# Patient Record
Sex: Female | Born: 1965 | Race: White | Hispanic: No | Smoking: Never smoker
Health system: Southern US, Community
[De-identification: ages and names within clinical notes are randomized; demographics above are authoritative.]

## PROBLEM LIST (undated history)

## (undated) DIAGNOSIS — Z8701 Personal history of pneumonia (recurrent): Secondary | ICD-10-CM

## (undated) HISTORY — PX: OTHER SURGICAL HISTORY: SHX169

## (undated) HISTORY — DX: Personal history of pneumonia (recurrent): Z87.01

---

## 2015-04-11 ENCOUNTER — Ambulatory Visit (INDEPENDENT_AMBULATORY_CARE_PROVIDER_SITE_OTHER): Payer: BC Managed Care – PPO | Admitting: Family Medicine

## 2015-04-11 ENCOUNTER — Encounter: Payer: Self-pay | Admitting: Family Medicine

## 2015-04-11 VITALS — BP 129/84 | HR 62 | Temp 98.7°F | Ht 67.0 in | Wt 232.4 lb

## 2015-04-11 DIAGNOSIS — J209 Acute bronchitis, unspecified: Secondary | ICD-10-CM

## 2015-04-11 MED ORDER — HYDROCODONE-HOMATROPINE 5-1.5 MG/5ML PO SYRP: 5.0000 mL | ORAL_SOLUTION | Freq: Four times a day (QID) | ORAL | Status: DC | PRN

## 2015-04-11 MED ORDER — TRIAMCINOLONE ACETONIDE 40 MG/ML IJ SUSP
40.0000 mg | Freq: Once | INTRAMUSCULAR | Status: AC
  Administered 2015-04-11: 40 mg via INTRAMUSCULAR

## 2015-04-11 MED ORDER — FLUTICASONE PROPIONATE 50 MCG/ACT NA SUSP
2.0000 | Freq: Every day | NASAL | Status: DC
Start: 2015-04-11 — End: 2015-12-25

## 2015-04-11 MED ORDER — AZITHROMYCIN 250 MG PO TABS: ORAL_TABLET | ORAL | Status: DC

## 2015-04-11 NOTE — Progress Notes (Signed)
   HPI  Patient presents todayto establish care and discuss acute illness.  Plans that she's had a similar illness now 2-3 times over the last 3 months. She states that initially she moved to town from [facility] was seen in urgent care for cough, she was told it was a virus and did not completely resolve but her symptoms didn't get much better.  Early January, about 4 weeks ago she had similar illness with cough, malaise, and sinus congestion. This improved but did linger on.  Over the last 4 days she developed cough, nasal congestion, central chest pain with coughing and intermittent dyspnea. She does have malaise well. 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   PMH: Smoking status noted Past medical, surgical, social, family history reviewed and updated in EMR ROS: Per HPI  Objective: BP 129/84 mmHg  Pulse 62  Temp(Src) 98.7 F (37.1 C) (Oral)  Ht  (1.702 m)  Wt 232 lb 6.4 oz (105.416 kg)  BMI 36.39 kg/m2 Gen: NAD, alert, cooperative with exam HEENT: NCAT, nares with swollen mucosa and turbinates, TMs normal bilaterally, oropharynx clear with swollen tonsils CV: RRR, good S1/S2, no murmur Resp: CTABL, no wheezes, non-labored Ext: No edema, warm Neuro: Alert and oriented, No gross deficits  Assessment and plan:  # Acute bronchitis Given her current illness and current severity of cough with dyspnea I will go ahead and treat her with azithromycin Her lung exam is reassuring Given a shot of IM along today Hycodan for nighttime cough Flonase for postnasal drip Out of work today and tomorrow The clinic if worsening or does not improve as expected  2-3 months for annual exam   Murtis Sink, MD [facility] 04/11/2015, 3:45 PM

## 2015-04-11 NOTE — Patient Instructions (Signed)
Great to meet you!  I am treating you for acute bronchitis  Azithromycin is an antibiotic, be sure to finish all of the pills  Hycodan cough syrup has a narcotic in it, do not drive after taking it.  Flonase is for congestion and post nasal drip- twice daily for only 1 week (once daily os the standard everyday dose)  Acute Bronchitis Bronchitis is when the airways that extend from the windpipe into the lungs get red, puffy, and painful (inflamed). Bronchitis often causes thick spit (mucus) to develop. This leads to a cough. A cough is the most common symptom of bronchitis. In acute bronchitis, the condition usually begins suddenly and goes away over time (usually in 2 weeks). Smoking, allergies, and asthma can make bronchitis worse. Repeated episodes of bronchitis may cause more lung problems. HOME CARE  Rest.  Drink enough fluids to keep your pee (urine) clear or pale yellow (unless you need to limit fluids as told by your doctor).  Only take over-the-counter or prescription medicines as told by your doctor.  Avoid smoking and secondhand smoke. These can make bronchitis worse. If you are a smoker, think about using nicotine gum or skin patches. Quitting smoking will help your lungs heal faster.  Reduce the chance of getting bronchitis again by:  Washing your hands often.  Avoiding people with cold symptoms.  Trying not to touch your hands to your mouth, nose, or eyes.  Follow up with your doctor as told. GET HELP IF: Your symptoms do not improve after 1 week of treatment. Symptoms include:  Cough.  Fever.  Coughing up thick spit.  Body aches.  Chest congestion.  Chills.  Shortness of breath.  Sore throat. GET HELP RIGHT AWAY IF:   You have an increased fever.  You have chills.  You have severe shortness of breath.  You have bloody thick spit (sputum).  You throw up (vomit) often.  You lose too much body fluid (dehydration).  You have a severe  headache.  You faint. MAKE SURE YOU:   Understand these instructions.  Will watch your condition.  Will get help right away if you are not doing well or get worse.   This information is not intended to replace advice given to you by your health care provider. Make sure you discuss any questions you have with your health care provider.   Document Released: 08/13/2007 Document Revised: 10/27/2012 Document Reviewed: 08/17/2012 Elsevier Interactive Patient Education [employer]

## 2015-04-30 ENCOUNTER — Ambulatory Visit (INDEPENDENT_AMBULATORY_CARE_PROVIDER_SITE_OTHER): Payer: BC Managed Care – PPO | Admitting: Family Medicine

## 2015-04-30 ENCOUNTER — Encounter: Payer: Self-pay | Admitting: Family Medicine

## 2015-04-30 VITALS — BP 97/62 | HR 74 | Temp 97.3°F | Ht 67.0 in | Wt 230.4 lb

## 2015-04-30 DIAGNOSIS — J101 Influenza due to other identified influenza virus with other respiratory manifestations: Secondary | ICD-10-CM

## 2015-04-30 DIAGNOSIS — J209 Acute bronchitis, unspecified: Secondary | ICD-10-CM

## 2015-04-30 DIAGNOSIS — R51 Headache: Secondary | ICD-10-CM

## 2015-04-30 DIAGNOSIS — R059 Cough, unspecified: Secondary | ICD-10-CM

## 2015-04-30 DIAGNOSIS — R0981 Nasal congestion: Secondary | ICD-10-CM

## 2015-04-30 DIAGNOSIS — R05 Cough: Secondary | ICD-10-CM

## 2015-04-30 DIAGNOSIS — R519 Headache, unspecified: Secondary | ICD-10-CM

## 2015-04-30 LAB — POCT INFLUENZA A/B
INFLUENZA A, POC: POSITIVE — AB
INFLUENZA B, POC: NEGATIVE

## 2015-04-30 MED ORDER — HYDROCODONE-HOMATROPINE 5-1.5 MG/5ML PO SYRP: 5.0000 mL | ORAL_SOLUTION | Freq: Four times a day (QID) | ORAL | Status: DC | PRN

## 2015-04-30 NOTE — Addendum Note (Signed)
Addended by: Elenora Gamma on: 04/30/2015 04:01 PM   Modules accepted: Orders

## 2015-04-30 NOTE — Patient Instructions (Signed)

## 2015-04-30 NOTE — Progress Notes (Signed)
   HPI  Patient presents today for acute illness.  She describes 3 days of cough, nasal congestion, headache, shortness of breath, and malaise.  She's tolerating food and fluids normally.  She works as a Systems analyst.  She was seen about 3 weeks ago for similar illness and treated with azithromycin. She is worse this time than she was the first visit. She describes that her shortness of breath is much more than it was last time.  Hycodan helped well with the cough.  PMH: Smoking status noted ROS: Per HPI  Objective: BP 97/62 mmHg  Pulse 74  Temp(Src) 97.3 F (36.3 C) (Oral)  Ht  (1.702 m)  Wt 230 lb 6.4 oz (104.509 kg)  BMI 36.08 kg/m2 Gen: NAD, alert, cooperative with exam HEENT: NCAT, TMs normal bilaterally, nares clear, oropharynx clear CV: RRR, good S1/S2, no murmur Resp: Coarse breath sounds in the left lower lung field Ext: No edema, warm Neuro: Alert and oriented, No gross deficits  Assessment and plan:  # Influenza a Treat symptomatically, outside of the 48-hour window for Tamiflu treatment Hycodan for nighttime cough, Tylenol, Motrin, fluids Return to clinic for Worsening symptoms or further concerns  We discussed that hycodan is a narcotic cough syrup and should be used sparingly. She did not request it   Murtis Sink, MD [facility] 04/30/2015, 3:39 PM

## 2015-05-03 ENCOUNTER — Telehealth: Payer: Self-pay | Admitting: Family Medicine

## 2015-05-03 NOTE — Telephone Encounter (Signed)
Ok for note 

## 2015-05-03 NOTE — Telephone Encounter (Signed)
I am ok with note.   Murtis Sink, MD [facility] 05/03/2015, 9:49 AM

## 2015-05-03 NOTE — Telephone Encounter (Signed)
Work note printed and put in the mail, pt aware.

## 2015-05-14 ENCOUNTER — Other Ambulatory Visit: Payer: Self-pay | Admitting: Family Medicine

## 2015-05-15 MED ORDER — TRI-LINYAH 0.18/0.215/0.25 MG-35 MCG PO TABS: 1.0000 | ORAL_TABLET | Freq: Every day | ORAL | Status: DC

## 2015-05-15 NOTE — Telephone Encounter (Signed)
i have refilled  Please caution her that risks of serious side effects (blood clots etc) go up as we get older on these meds. We should be sure to discuss alternatives at an appointment in the next few months. Be sure to keep up with pap smears every 3 years.   Murtis SinkSam Aryona Sill, MD [facility] 05/15/2015, 9:18 AM

## 2015-05-15 NOTE — Addendum Note (Signed)
Addended by: Elenora GammaBRADSHAW, Emilya Justen L on: 05/15/2015 09:20 AM   Modules accepted: Orders

## 2015-05-15 NOTE — Telephone Encounter (Signed)
New Patient since Feb, can we refill?

## 2015-05-15 NOTE — Telephone Encounter (Signed)
Left pt a message rx has been sent to pharmacy but to call back and discuss the rest of the note.

## 2015-06-18 ENCOUNTER — Encounter: Payer: Self-pay | Admitting: *Deleted

## 2015-06-18 ENCOUNTER — Encounter: Payer: Self-pay | Admitting: Family Medicine

## 2015-06-18 ENCOUNTER — Ambulatory Visit (INDEPENDENT_AMBULATORY_CARE_PROVIDER_SITE_OTHER): Payer: BC Managed Care – PPO | Admitting: Family Medicine

## 2015-06-18 VITALS — BP 122/66 | HR 65 | Temp 97.4°F | Ht 67.0 in | Wt 234.4 lb

## 2015-06-18 DIAGNOSIS — Z0000 Encounter for general adult medical examination without abnormal findings: Secondary | ICD-10-CM

## 2015-06-18 DIAGNOSIS — Z01419 Encounter for gynecological examination (general) (routine) without abnormal findings: Secondary | ICD-10-CM

## 2015-06-18 NOTE — Patient Instructions (Addendum)
Great to see you!  Let splan on seeing you at least once a year for physical exam  You will need another pap in 3 years  wil call or send a mychart message within 1 week with your lab results

## 2015-06-18 NOTE — Progress Notes (Signed)
   HPI  Patient presents today here for physical exam Pap smear.  Patient has no complaints. She had a prolonged illness in the winter season is very happy to be healthy again.  She has been watching her diet pretty aggressively, limiting fried and fatty foods, carbohydrates, and portion control.  She is not exercising but is motivated to start.  As not currently sexually active We discussed the risks and benefits of oral contraceptives and she like to continue She is still having regular periods that last about 5 days every month on schedule   PMH: Smoking status noted ROS: Per HPI, otherwise negative  Objective: BP 122/66 mmHg  Pulse 65  Temp(Src) 97.4 F (36.3 C) (Oral)  Ht _0  (1.702 m)  Wt 234 lb 6.4 oz (106.323 kg)  BMI 36.70 kg/m2  LMP 06/11/2015 Gen: NAD, alert, cooperative with exam HEENT: NCAT, oral pharynx clear, TMs normal bilaterally, nares clear, PERRLA CV: RRR, good S1/S2, no murmur Resp: CTABL, no wheezes, non-labored Abd: SNTND, BS present, no guarding or organomegaly Ext: No edema, warm Neuro: Alert and oriented, strength 5/5 and sensation intact in all 4 extremities GU: Normal-appearing female perineum, nulliparous cervix, minimal DC No CMT, no adnexal fullness   Assessment and plan:  # Well woman exam mammo ordered, fasting labs Pap collected Annual exam unless needed sooner Discussed diet and exercise, she is motivated.   Orders Placed This Encounter  Procedures  . Lipid Panel  . CMP14+EGFR  . CBC with Differential  . TSH     Laroy Apple, MD [facility] 06/18/2015, 12:00 PM

## 2015-06-19 LAB — CMP14+EGFR
A/G RATIO: 1.4 (ref 1.2–2.2)
ALBUMIN: 3.9 g/dL (ref 3.5–5.5)
ALK PHOS: 71 IU/L (ref 39–117)
ALT: 11 IU/L (ref 0–32)
AST: 13 IU/L (ref 0–40)
BILIRUBIN TOTAL: 0.2 mg/dL (ref 0.0–1.2)
BUN / CREAT RATIO: 13 (ref 9–23)
BUN: 9 mg/dL (ref 6–24)
CHLORIDE: 102 mmol/L (ref 96–106)
CO2: 20 mmol/L (ref 18–29)
Calcium: 9.3 mg/dL (ref 8.7–10.2)
Creatinine, Ser: 0.71 mg/dL (ref 0.57–1.00)
GFR calc Af Amer: 116 mL/min/{1.73_m2} (ref >59)
GFR calc non Af Amer: 101 mL/min/{1.73_m2} (ref >59)
GLOBULIN, TOTAL: 2.7 g/dL (ref 1.5–4.5)
Glucose: 85 mg/dL (ref 65–99)
POTASSIUM: 4.2 mmol/L (ref 3.5–5.2)
SODIUM: 141 mmol/L (ref 134–144)
Total Protein: 6.6 g/dL (ref 6.0–8.5)

## 2015-06-19 LAB — CBC WITH DIFFERENTIAL/PLATELET
BASOS ABS: 0 10*3/uL (ref 0.0–0.2)
Basos: 0 %
EOS (ABSOLUTE): 0.1 10*3/uL (ref 0.0–0.4)
EOS: 1 %
HEMATOCRIT: 36.1 % (ref 34.0–46.6)
HEMOGLOBIN: 11.9 g/dL (ref 11.1–15.9)
IMMATURE GRANS (ABS): 0 10*3/uL (ref 0.0–0.1)
Immature Granulocytes: 1 %
LYMPHS ABS: 1.4 10*3/uL (ref 0.7–3.1)
Lymphs: 22 %
MCH: 29.2 pg (ref 26.6–33.0)
MCHC: 33 g/dL (ref 31.5–35.7)
MCV: 89 fL (ref 79–97)
Monocytes Absolute: 0.4 10*3/uL (ref 0.1–0.9)
Monocytes: 6 %
NEUTROS ABS: 4.7 10*3/uL (ref 1.4–7.0)
Neutrophils: 70 %
Platelets: 301 10*3/uL (ref 150–379)
RBC: 4.08 x10E6/uL (ref 3.77–5.28)
RDW: 13 % (ref 12.3–15.4)
WBC: 6.6 10*3/uL (ref 3.4–10.8)

## 2015-06-19 LAB — LIPID PANEL
CHOL/HDL RATIO: 2.7 ratio (ref 0.0–4.4)
Cholesterol, Total: 176 mg/dL (ref 100–199)
HDL: 65 mg/dL (ref >39)
LDL Calculated: 88 mg/dL (ref 0–99)
Triglycerides: 116 mg/dL (ref 0–149)
VLDL Cholesterol Cal: 23 mg/dL (ref 5–40)

## 2015-06-19 LAB — TSH: TSH: 2 u[IU]/mL (ref 0.450–4.500)

## 2015-06-20 LAB — PAP IG W/ RFLX HPV ASCU: PAP Smear Comment: 0

## 2015-10-08 ENCOUNTER — Encounter: Payer: BC Managed Care – PPO | Admitting: *Deleted

## 2015-12-25 ENCOUNTER — Ambulatory Visit (INDEPENDENT_AMBULATORY_CARE_PROVIDER_SITE_OTHER): Payer: BC Managed Care – PPO | Admitting: Family Medicine

## 2015-12-25 ENCOUNTER — Encounter: Payer: Self-pay | Admitting: Family Medicine

## 2015-12-25 VITALS — BP 128/81 | HR 68 | Temp 97.6°F | Ht 67.0 in | Wt 251.6 lb

## 2015-12-25 DIAGNOSIS — J209 Acute bronchitis, unspecified: Secondary | ICD-10-CM

## 2015-12-25 MED ORDER — HYDROCODONE-HOMATROPINE 5-1.5 MG/5ML PO SYRP: 5.0000 mL | ORAL_SOLUTION | Freq: Four times a day (QID) | ORAL | 0 refills | Status: DC | PRN

## 2015-12-25 MED ORDER — METHYLPREDNISOLONE ACETATE 80 MG/ML IJ SUSP
80.0000 mg | Freq: Once | INTRAMUSCULAR | Status: AC
  Administered 2015-12-25: 80 mg via INTRAMUSCULAR

## 2015-12-25 NOTE — Addendum Note (Signed)
Addended by: Lorelee CoverOSTOSKY, JESSICA C on: 12/25/2015 10:46 AM   Modules accepted: Orders

## 2015-12-25 NOTE — Progress Notes (Signed)
   HPI  Patient presents today here with cough and cold symptoms.  Patient reports 4 days of cough, nasal congestion, occipital headache, and one day symptoms of sore throat that has resolved.  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  She has body aches and malaise as well. She is tolerating foods and fluids normally. Sh e is not a smoker.   PMH: Smoking status noted ROS: Per HPI  Objective: BP 128/81   Pulse 68   Temp 97.6 F (36.4 C) (Oral)   Ht 5\' 7"  (1.702 m)   Wt 251 lb 9.6 oz (114.1 kg)   BMI 39.41 kg/m  Gen: NAD, alert, cooperative with exam HEENT: NCAT, nares with swelling, mild erythema of the oropharynx, TMs WNL CV: RRR, good S1/S2, no murmur Resp: CTABL, no wheezes, non-labored Ext: No edema, warm Neuro: Alert and oriented, No gross deficits  Assessment and plan:  # Acute bronchitis Likely viral etiology, given IM depo medroll, hycodan for night tim ecough, and discussed supportive care  RTC if worsening or not improving s expected.     Meds ordered this encounter  Medications  . HYDROcodone-homatropine (HYCODAN) 5-1.5 MG/5ML syrup    Sig: Take 5 mLs by mouth every 6 (six) hours as needed for cough.    Dispense:  120 mL    Refill:  0    Murtis SinkSam Bradshaw, MD [facility] 12/25/2015, 10:44 AM

## 2015-12-25 NOTE — Patient Instructions (Signed)
Great to see you!  Try mucinex DM 12 hour for day-time cough  Hycodan has hydrocodone in it, do not drive after you take it.    Acute Bronchitis Bronchitis is when the airways that extend from the windpipe into the lungs get red, puffy, and painful (inflamed). Bronchitis often causes thick spit (mucus) to develop. This leads to a cough. A cough is the most common symptom of bronchitis. In acute bronchitis, the condition usually begins suddenly and goes away over time (usually in 2 weeks). Smoking, allergies, and asthma can make bronchitis worse. Repeated episodes of bronchitis may cause more lung problems. HOME CARE  Rest.  Drink enough fluids to keep your pee (urine) clear or pale yellow (unless you need to limit fluids as told by your doctor).  Only take over-the-counter or prescription medicines as told by your doctor.  Avoid smoking and secondhand smoke. These can make bronchitis worse. If you are a smoker, think about using nicotine gum or skin patches. Quitting smoking will help your lungs heal faster.  Reduce the chance of getting bronchitis again by:  Washing your hands often.  Avoiding people with cold symptoms.  Trying not to touch your hands to your mouth, nose, or eyes.  Follow up with your doctor as told. GET HELP IF: Your symptoms do not improve after 1 week of treatment. Symptoms include:  Cough.  Fever.  Coughing up thick spit.  Body aches.  Chest congestion.  Chills.  Shortness of breath.  Sore throat. GET HELP RIGHT AWAY IF:   You have an increased fever.  You have chills.  You have severe shortness of breath.  You have bloody thick spit (sputum).  You throw up (vomit) often.  You lose too much body fluid (dehydration).  You have a severe headache.  You faint. MAKE SURE YOU:   Understand these instructions.  Will watch your condition.  Will get help right away if you are not doing well or get worse.   This information is not  intended to replace advice given to you by your health care provider. Make sure you discuss any questions you have with your health care provider.   Document Released: 08/13/2007 Document Revised: 10/27/2012 Document Reviewed: 08/17/2012 Elsevier Interactive Patient Education [employer] Else[employer] Inc.

## 2016-02-04 ENCOUNTER — Ambulatory Visit (INDEPENDENT_AMBULATORY_CARE_PROVIDER_SITE_OTHER): Payer: BC Managed Care – PPO | Admitting: Nurse Practitioner

## 2016-02-04 ENCOUNTER — Encounter: Payer: Self-pay | Admitting: Nurse Practitioner

## 2016-02-04 VITALS — BP 149/82 | HR 59 | Temp 97.4°F | Ht 67.0 in | Wt 249.0 lb

## 2016-02-04 DIAGNOSIS — J209 Acute bronchitis, unspecified: Secondary | ICD-10-CM | POA: Diagnosis not present

## 2016-02-04 MED ORDER — HYDROCODONE-HOMATROPINE 5-1.5 MG/5ML PO SYRP: 5.0000 mL | ORAL_SOLUTION | Freq: Four times a day (QID) | ORAL | 0 refills | Status: DC | PRN

## 2016-02-04 MED ORDER — AZITHROMYCIN 250 MG PO TABS: ORAL_TABLET | ORAL | 0 refills | Status: DC

## 2016-02-04 MED ORDER — METHYLPREDNISOLONE ACETATE 80 MG/ML IJ SUSP
80.0000 mg | Freq: Once | INTRAMUSCULAR | Status: AC
  Administered 2016-02-04: 80 mg via INTRAMUSCULAR

## 2016-02-04 NOTE — Progress Notes (Signed)
Subjective:     Brenda Hill is a 50 y.o. female here for evaluation of a cough. Onset of symptoms was 4 days ago. Symptoms have been gradually worsening since that time. The cough is barky, dry, harsh, hoarse and nonproductive and is aggravated by nothing. Associated symptoms include: change in voice and shortness of breath. Patient does not have a history of asthma. Patient does not have a history of environmental allergens. Patient has not traveled recently. Patient does have a history of smoking. Patient has had a previous chest x-ray. Patient has not had a PPD done.  The following portions of the patient's history were reviewed and updated as appropriate: allergies, current medications, past family history, past medical history, past social history, past surgical history and problem list.  Review of Systems Pertinent items noted in HPI and remainder of comprehensive ROS otherwise negative.    Objective:     BP (!) 149/82   Pulse (!) 59   Temp 97.4 F (36.3 C) (Oral)   Ht 5\' 7"  (1.702 m)   Wt 249 lb (112.9 kg)   BMI 39.00 kg/m  General appearance: alert, cooperative and mild distress Eyes: conjunctivae/corneas clear. PERRL, EOM's intact. Fundi benign. Ears: normal TM's and external ear canals both ears Nose: Nares normal. Septum midline. Mucosa normal. No drainage or sinus tenderness. Throat: lips, mucosa, and tongue normal; teeth and gums normal Neck: no adenopathy, no carotid bruit, no JVD, supple, symmetrical, trachea midline and thyroid not enlarged, symmetric, no tenderness/mass/nodules Lungs: clear to auscultation bilaterally and deep dry cough Heart: regular rate and rhythm, S1, S2 normal, no murmur, click, rub or gallop    Assessment:    Acute Bronchitis    Plan:     1. Take meds as prescribed 2. Use a cool mist humidifier especially during the winter months and when heat has been humid. 3. Use saline nose sprays frequently 4. Saline irrigations of the nose can be  very helpful if done frequently.  * 4X daily for 1 week*  * Use of a nettie pot can be helpful with this. Follow directions with this* 5. Drink plenty of fluids 6. Keep thermostat turn down low 7.For any cough or congestion  Use plain Mucinex- regular strength or max strength is fine   * Children- consult with Pharmacist for dosing 8. For fever or aces or pains- take tylenol or ibuprofen appropriate for age and weight.  * for fevers greater than 101 orally you may alternate ibuprofen and tylenol every  3 hours.   Meds ordered this encounter  Medications  . azithromycin (ZITHROMAX Z-PAK) 250 MG tablet    Sig: As directed    Dispense:  6 tablet    Refill:  0    Order Specific Question:   Supervising Provider    Answer:   VINCENT, CAROL L [[id number]]  . HYDROcodone-homatropine (HYCODAN) 5-1.5 MG/5ML syrup    Sig: Take 5 mLs by mouth every 6 (six) hours as needed for cough.    Dispense:  120 mL    Refill:  0    Order Specific Question:   Supervising Provider    Answer:   VINCENT, CAROL L [[id number]]  . methylPREDNISolone acetate (DEPO-MEDROL) injection 80 mg   Brenda Daphine DeutscherMartin, FNP

## 2016-02-04 NOTE — Patient Instructions (Signed)

## 2016-02-08 ENCOUNTER — Encounter: Payer: Self-pay | Admitting: Family Medicine

## 2016-02-08 ENCOUNTER — Ambulatory Visit (INDEPENDENT_AMBULATORY_CARE_PROVIDER_SITE_OTHER): Payer: BC Managed Care – PPO

## 2016-02-08 ENCOUNTER — Ambulatory Visit (INDEPENDENT_AMBULATORY_CARE_PROVIDER_SITE_OTHER): Payer: BC Managed Care – PPO | Admitting: Family Medicine

## 2016-02-08 VITALS — BP 115/75 | HR 68 | Temp 97.9°F | Ht 67.0 in | Wt 246.8 lb

## 2016-02-08 DIAGNOSIS — R05 Cough: Secondary | ICD-10-CM | POA: Diagnosis not present

## 2016-02-08 DIAGNOSIS — R059 Cough, unspecified: Secondary | ICD-10-CM

## 2016-02-08 MED ORDER — ALBUTEROL SULFATE HFA 108 (90 BASE) MCG/ACT IN AERS: 2.0000 | INHALATION_SPRAY | Freq: Four times a day (QID) | RESPIRATORY_TRACT | 0 refills | Status: DC | PRN

## 2016-02-08 MED ORDER — BENZONATATE 200 MG PO CAPS: 200.0000 mg | ORAL_CAPSULE | Freq: Two times a day (BID) | ORAL | 0 refills | Status: DC | PRN

## 2016-02-08 NOTE — Patient Instructions (Signed)
Great to see you!  Try the inhaler 2 puffs 4 times a day, also try tessalon poerles.   You have any sudden worsening symptoms, increasing shortness of breath, chest pain, or coughing up blood please seek emergency medical care.

## 2016-02-08 NOTE — Progress Notes (Signed)
   HPI  Patient presents today with persistent cough. Patient was seen initially about 5-6 weeks ago with one week of cough. She was treated with IM Depo-Medrol and supportive care that time.  She was seen again 5 days ago and treated with azithromycin, Hycodan, and I am Depo-Medrol again.  Over the week she has felt better on one day, however that evening she had the worst symptoms that she has had throughout the entire episode. She persistently has cough. She states that she has increased shortness of breath. She is not a smoker.  She denies chest pain except with severe coughing fits. She's tolerating foods and fluids normally. She has no sore throat, chills, sweats, or fever.  PMH: Smoking status noted ROS: Per HPI  Objective: BP 115/75   Pulse 68   Temp 97.9 F (36.6 C) (Oral)   Ht 5\' 7"  (1.702 m)   Wt 246 lb 12.8 oz (111.9 kg)   BMI 38.65 kg/m  Gen: NAD, alert, cooperative with exam HEENT: NCAT, oropharynx moist and clear, TMs normal bilaterally, nares clear CV: RRR, good S1/S2, no murmur Resp: CTABL, no wheezes, non-labored, very frequent cough and  Ext: No edema, warm Neuro: Alert and oriented, No gross deficits  CXR- Hyper expanded, no infiltrate  Assessment and plan:  # Cough, acute bronchitis I agree, most likely acute bronchitis Patient has been treated with 2 rounds of IM Depo-Medrol with not much improvement. Azithromycin course is finished, however will be effective for 5 additional days He had mild improvement with nebulizer in clinic today of albuterol Albuterol MDI given, Tessalon Perles given  I did consider pulmonary embolism, however her Wells score is 0,   Orders Placed This Encounter  Procedures  . DG Chest 2 View    Order Specific Question:   Reason for Exam (SYMPTOM  OR DIAGNOSIS REQUIRED)    Answer:   cough    Order Specific Question:   Is the patient pregnant?    Answer:   No    Order Specific Question:   Preferred imaging location?    Answer:   Internal  . CBC with Differential    Meds ordered this encounter  Medications  . benzonatate (TESSALON) 200 MG capsule    Sig: Take 1 capsule (200 mg total) by mouth 2 (two) times daily as needed for cough.    Dispense:  20 capsule    Refill:  0  . albuterol (PROVENTIL HFA;VENTOLIN HFA) 108 (90 Base) MCG/ACT inhaler    Sig: Inhale 2 puffs into the lungs every 6 (six) hours as needed for wheezing or shortness of breath.    Dispense:  1 Inhaler    Refill:  0    Murtis SinkSam Haniel Fix, MD [facility] 02/08/2016, 11:15 AM

## 2016-04-05 ENCOUNTER — Other Ambulatory Visit: Payer: Self-pay | Admitting: Family Medicine

## 2016-06-27 ENCOUNTER — Encounter: Payer: Self-pay | Admitting: Family Medicine

## 2016-06-27 ENCOUNTER — Ambulatory Visit (INDEPENDENT_AMBULATORY_CARE_PROVIDER_SITE_OTHER): Payer: BC Managed Care – PPO | Admitting: Family Medicine

## 2016-06-27 VITALS — BP 121/76 | HR 57 | Temp 97.1°F | Ht 67.0 in | Wt 260.0 lb

## 2016-06-27 DIAGNOSIS — T148XXA Other injury of unspecified body region, initial encounter: Secondary | ICD-10-CM

## 2016-06-27 DIAGNOSIS — M79604 Pain in right leg: Secondary | ICD-10-CM | POA: Diagnosis not present

## 2016-06-27 DIAGNOSIS — M542 Cervicalgia: Secondary | ICD-10-CM

## 2016-06-27 DIAGNOSIS — M545 Low back pain: Secondary | ICD-10-CM

## 2016-06-27 DIAGNOSIS — M79605 Pain in left leg: Secondary | ICD-10-CM

## 2016-06-27 MED ORDER — NAPROXEN 500 MG PO TABS: 500.0000 mg | ORAL_TABLET | Freq: Two times a day (BID) | ORAL | 0 refills | Status: DC

## 2016-06-27 MED ORDER — CYCLOBENZAPRINE HCL 10 MG PO TABS: 10.0000 mg | ORAL_TABLET | Freq: Three times a day (TID) | ORAL | 0 refills | Status: DC | PRN

## 2016-06-27 NOTE — Patient Instructions (Addendum)
Great to see you!  Try the naproxen 2 times daily with food for 10 days Use flexeril as needed, this may make you sleepy.   Try to reduce the amount of time you rare exercising and slowly build over th eperiod of a few weeks.

## 2016-06-27 NOTE — Progress Notes (Signed)
   HPI  Patient presents today with pain.  Patient explains that she developed left leg pain, back pain, and neck pain after beginning walking a few weeks ago. Asian states that she began her walking regimen on a daily basis. She had left leg pain after the week, then she mowed her grass with a positional or causing worsening left leg pain, left low back pain, and neck pain.  Leg pain is described as lateral lower leg pain hurting most the time associated with the left low back pain. Her left low back pain does radiate down to the left lower leg, she also has midline neck pain on occasion, this is less severe than the other 2 pains, however it is persistent and does seem to be exacerbated by exercise.  She is very interested in losing weight and wanting to exercise more.  PMH: Smoking status noted ROS: Per HPI  Objective: BP 121/76   Pulse (!) 57   Temp 97.1 F (36.2 C) (Oral)   Ht  (1.702 m)   Wt 260 lb (117.9 kg)   BMI 40.72 kg/m  Gen: NAD, alert, cooperative with exam HEENT: NCAT CV: RRR, good S1/S2, no murmur Resp: CTABL, no wheezes, non-labored Ext: No edema, warm Neuro: Alert and oriented, No gross deficits  MSK:  Negative Pearlean Brownie test of the left hip No tenderness to palpation of the left lateral lower leg, no tenderness to palpation of the midline cervical spine or lumbar spine or paraspinal muscles.  Assessment and plan:  # Muscle strain Several areas consistent with muscle strain and possible sciatica. Treat with scheduled NSAIDs 10 days, Flexeril, and reducing the amount of time exercising. I recommended a lower starting point on intensity of exercise with a slow increase over time.   Meds ordered this encounter  Medications  . naproxen (NAPROSYN) 500 MG tablet    Sig: Take 1 tablet (500 mg total) by mouth 2 (two) times daily with a meal.    Dispense:  20 tablet    Refill:  0  . cyclobenzaprine (FLEXERIL) 10 MG tablet    Sig: Take 1 tablet (10 mg total)  by mouth 3 (three) times daily as needed for muscle spasms.    Dispense:  30 tablet    Refill:  0    Murtis Sink, MD [facility] 06/27/2016, 2:45 PM

## 2016-07-29 ENCOUNTER — Encounter: Payer: Self-pay | Admitting: Family Medicine

## 2016-07-29 ENCOUNTER — Ambulatory Visit (INDEPENDENT_AMBULATORY_CARE_PROVIDER_SITE_OTHER): Payer: BC Managed Care – PPO | Admitting: Family Medicine

## 2016-07-29 VITALS — BP 110/74 | HR 63 | Temp 97.7°F | Ht 67.0 in | Wt 255.2 lb

## 2016-07-29 DIAGNOSIS — R1013 Epigastric pain: Secondary | ICD-10-CM | POA: Diagnosis not present

## 2016-07-29 MED ORDER — PANTOPRAZOLE SODIUM 40 MG PO TBEC: 40.0000 mg | DELAYED_RELEASE_TABLET | Freq: Every day | ORAL | 0 refills | Status: DC

## 2016-07-29 MED ORDER — ONDANSETRON 4 MG PO TBDP: 4.0000 mg | ORAL_TABLET | Freq: Three times a day (TID) | ORAL | 1 refills | Status: AC | PRN

## 2016-07-29 MED ORDER — DICYCLOMINE HCL 20 MG PO TABS: 20.0000 mg | ORAL_TABLET | Freq: Three times a day (TID) | ORAL | 0 refills | Status: DC

## 2016-07-29 NOTE — Patient Instructions (Signed)
Great to see you!  Start protonix 1 pill once daily   We are working on the ultrasound.

## 2016-07-29 NOTE — Progress Notes (Signed)
   HPI  Patient presents today here with abdominal pain for follow-up from the emergency room.  Patient explains that she's had 3 separate episodes of similar abdominal pain with nausea vomiting and diarrhea. These began about 3 weeks ago and lasted a few days each. On the most recent episode she could not tolerate fluids by mouth and ended up in the emergency room for IV fluids.  She's doing very well with Zofran twice daily and Bentyl twice daily.  Her abdominal pain has improved, however she has not tested anything more than thin liquids since she was seen in the emergency room.  The emergency room they were worried about her gall bladder  The pain is described as burning   PMH: Smoking status noted ROS: Per HPI  Objective: BP 110/74   Pulse 63   Temp 97.7 F (36.5 C) (Oral)   Ht 5\' 7"  (1.702 m)   Wt 255 lb 3.2 oz (115.8 kg)   BMI 39.97 kg/m  Gen: NAD, alert, cooperative with exam HEENT: NCAT CV: RRR, good S1/S2, no murmur Resp: CTABL, no wheezes, non-labored Abd: SNTND, BS present, no guarding or organomegaly Ext: No edema, warm Neuro: Alert and oriented, No gross deficits  Assessment and plan:  # Abdominal pain Improved, continue Zofran, Bentyl as needed Refilled Start Protonix Abdominal ultrasound to rule out gallstones CLose follow up if not improving with pain and diet not advancing- carefully advance diet.      Orders Placed This Encounter  Procedures  . US Abdomen Complete    Standing Status:   Future    Standing Expiration Date:   09/28/2017    Order Specific Question:   Reason for Exam (SYMPTOM  OR DIAGNOSIS REQUIRED)    Answer:   abd pain, r/o gallstones    Order Specific Question:   Preferred imaging location?    Answer:   [id number]    Meds ordered this encounter  Medications  . DISCONTD: dicyclomine (BENTYL) 20 MG tablet    Sig: Take by mouth.  . DISCONTD: ondansetron (ZOFRAN-ODT) 4 MG disintegrating tablet    Sig: Take by mouth.    . pantoprazole (PROTONIX) 40 MG tablet    Sig: Take 1 tablet (40 mg total) by mouth daily.    Dispense:  30 tablet    Refill:  0  . dicyclomine (BENTYL) 20 MG tablet    Sig: Take 1 tablet (20 mg total) by mouth 4 (four) times daily -  before meals and at bedtime.    Dispense:  30 tablet    Refill:  0  . ondansetron (ZOFRAN-ODT) 4 MG disintegrating tablet    Sig: Take 1 tablet (4 mg total) by mouth every 8 (eight) hours as needed for nausea or vomiting.    Dispense:  30 tablet    Refill:  1    Murtis SinkSam Sabryn Preslar, MD [facility] 07/29/2016, 3:30 PM

## 2016-08-01 ENCOUNTER — Other Ambulatory Visit: Payer: Self-pay

## 2016-08-05 ENCOUNTER — Ambulatory Visit
Admission: RE | Admit: 2016-08-05 | Discharge: 2016-08-05 | Disposition: A | Discharge status: Home / Self Care | Payer: BC Managed Care – PPO | Source: Ambulatory Visit | Attending: Family Medicine | Admitting: Family Medicine

## 2016-08-05 DIAGNOSIS — R1013 Epigastric pain: Secondary | ICD-10-CM

## 2016-08-11 ENCOUNTER — Ambulatory Visit (INDEPENDENT_AMBULATORY_CARE_PROVIDER_SITE_OTHER): Payer: BC Managed Care – PPO | Admitting: Family Medicine

## 2016-08-11 VITALS — BP 114/70 | HR 65 | Temp 97.8°F | Ht 67.0 in | Wt 249.8 lb

## 2016-08-11 DIAGNOSIS — R112 Nausea with vomiting, unspecified: Secondary | ICD-10-CM | POA: Diagnosis not present

## 2016-08-11 NOTE — Progress Notes (Signed)
   HPI  Patient presents today for follow-up nausea and vomiting.  Patient was seen about 2 weeks ago for her third episode of nausea and vomiting after eating. She has such severe vomiting that she had to go to the emergency room for IV fluids. She had labs there that were unrevealing for etiology.  Patient had an ultrasound after our visit which ruled out cholecystitis.  Patient states that she is feeling much better, she has been tolerating solid foods but only chicken , Malaw[place]urkey, or breads.   Has any abdominal pain. She has not had a vomiting episode and more than 5 days.  After her ultrasound she tried hamburger which caused indigestion followed by vomiting. She has been belching more than usual. She has tolerated chicken, [place]urkey, carbohydrates since that time.   PMH: Smoking status noted ROS: Per HPI  Objective: BP 114/70   Pulse 65   Temp 97.8 F (36.6 C) (Oral)   Ht 5\' 7"  (1.702 m)   Wt 249 lb 12.8 oz (113.3 kg)   BMI 39.12 kg/m  Gen: NAD, alert, cooperative with exam HEENT: NCAT CV: RRR, good S1/S2, no murmur Resp: CTABL, no wheezes, non-labored Abd: SNTND, BS present, no guarding or organomegaly Ext: No edema, warm Neuro: Alert and oriented, No gross deficits  Assessment and plan:  # Nausea and vomiting Persistent, 3 recurrent episodes in the last month causing an ER visit Refer to GI, appreciate the recommendations Advance diet slowly Start PPI, patient has not started. Labs were checked in the ER, no other clear need for advanced imaging.    Orders Placed This Encounter  Procedures  . Ambulatory referral to Gastroenterology    Referral Priority:   Routine    Referral Type:   Consultation    Referral Reason:   Specialty Services Required    Number of Visits Requested:   1    Murtis SinkSam Shamona Wirtz, MD [facility] 08/11/2016, 3:58 PM

## 2016-08-12 ENCOUNTER — Encounter: Payer: Self-pay | Admitting: Physician Assistant

## 2016-08-19 ENCOUNTER — Telehealth: Payer: Self-pay

## 2016-08-19 MED ORDER — ONDANSETRON 4 MG PO TBDP: 4.0000 mg | ORAL_TABLET | Freq: Three times a day (TID) | ORAL | 1 refills | Status: DC | PRN

## 2016-08-19 NOTE — Telephone Encounter (Signed)
Refill written  Murtis SinkSam Bradshaw, MD [facility] 08/19/2016, 1:02 PM

## 2016-08-21 ENCOUNTER — Ambulatory Visit (INDEPENDENT_AMBULATORY_CARE_PROVIDER_SITE_OTHER): Payer: BC Managed Care – PPO | Admitting: Physician Assistant

## 2016-08-21 ENCOUNTER — Encounter: Payer: Self-pay | Admitting: Physician Assistant

## 2016-08-21 VITALS — BP 106/68 | HR 72 | Ht 67.0 in | Wt 248.4 lb

## 2016-08-21 DIAGNOSIS — Z1212 Encounter for screening for malignant neoplasm of rectum: Secondary | ICD-10-CM

## 2016-08-21 DIAGNOSIS — R197 Diarrhea, unspecified: Secondary | ICD-10-CM | POA: Diagnosis not present

## 2016-08-21 DIAGNOSIS — R1013 Epigastric pain: Secondary | ICD-10-CM | POA: Diagnosis not present

## 2016-08-21 DIAGNOSIS — Z1211 Encounter for screening for malignant neoplasm of colon: Secondary | ICD-10-CM

## 2016-08-21 NOTE — Progress Notes (Signed)
Agree with assessment and plan as outlined.  

## 2016-08-21 NOTE — Patient Instructions (Signed)
If you are age 50 or older, your body mass index should be between 23-30. Your Body mass index is 38.9 kg/m. If this is out of the aforementioned range listed, please consider follow up with your Primary Care Provider.  If you are age 50 or younger, your body mass index should be between 19-25. Your Body mass index is 38.9 kg/m. If this is out of the aformentioned range listed, please consider follow up with your Primary Care Provider.   You have been scheduled for an endoscopy. Please follow written instructions given to you at your visit today. If you use inhalers (even only as needed), please bring them with you on the day of your procedure. Your physician has requested that you go to www.startemmi.com and enter the access code given to you at your visit today. This web site gives a general overview about your procedure. However, you should still follow specific instructions given to you by our office regarding your preparation for the procedure.  Start Pantoprazole 40 mg daily 30-60 minutes before breakfast.   You have been given GERD literature.  Recall for colonoscopy in two months.  Thank you for choosing me and [facility].   Hyacinth MeekerJennifer Lemmon, PA-C

## 2016-08-21 NOTE — Progress Notes (Signed)
Chief Complaint: Nausea and Vomiting, abdominal pain  HPI:  Brenda Hill is a 51 year old Caucasian female with a past medical history as listed below,  who was referred to me by Elenora Gamma, MD for a complaint of nausea and vomiting .    Patient had a recent ultrasound of the abdomen 08/05/16 which showed no gallstones or definitive findings of gallbladder abnormality. Pancreas is suboptimally evaluated secondary bowel gas. Portions visualized were unremarkable. Otherwise negative exam. CBC and CMP completed 07/26/16 were normal.   Patient was seen in the ER on 07/26/16 for "flulike symptoms" including generalized weakness and a low-grade temp with nausea vomiting and diarrhea. Patient was prescribed Dicyclomine 20 mg every 8 hours as needed as well as Zofran 4 mg every 6 hours at that time.   According to referring physician's notes patient was seen 08/11/16 and it was described that she had been seen 2 weeks ago for her third episode of nausea and vomiting after eating. At that time she had such severe vomiting that she had to go the emergency room for IV fluids. Patient had an ultrasound as above which was unrevealing. She was feeling much better at that time and tolerating solid foods. She had not had a vomiting episode for more than 5 days. Patient was encouraged to start her PPI and told to follow with [place].   Today, the patient describes that since the beginning of May she has had 3-4 episodes of nausea and vomiting associated with epigastric burning pain typically immediately after eating which then leads to diarrhea. Typically the vomiting will last up to 24 hours after it starts and then will stop on its own.  Patient tells me that she had a terrible episode of abdominal pain when seen in the ER as above. Since then the pain has not been so severe. The patient has altered her diet. Currently she is eating "bland foods" and "not quite as much". Patient tells me that she if she does end up  eating the wrong things such as a greasy fish sandwich or drink caffeinated tea she will feel an epigastric burning pain and become nauseous. She has not vomited this month. Patient also describes that her bowel movements are typically formed but if she eats a greasy food they will be somewhat loose. Patient tells me she never started her Pantoprazole 40 mg, in fact, she is not using any of her medications listed. She only uses Zofran as needed if she feels nauseous.   Patient denies fever, chills, blood in her stool, melena, weight loss, fatigue, anorexia, heartburn, reflux, dysphagia or symptoms that awaken her at night.  Past Medical History:  Diagnosis Date  . History of pneumonia     Past Surgical History:  Procedure Laterality Date  . none      Current Outpatient Prescriptions  Medication Sig Dispense Refill  . albuterol (PROVENTIL HFA;VENTOLIN HFA) 108 (90 Base) MCG/ACT inhaler Inhale 2 puffs into the lungs every 6 (six) hours as needed for wheezing or shortness of breath. 1 Inhaler 0  . dicyclomine (BENTYL) 20 MG tablet Take 1 tablet (20 mg total) by mouth 4 (four) times daily -  before meals and at bedtime. (Patient taking differently: Take 20 mg by mouth as needed. ) 30 tablet 0  . ondansetron (ZOFRAN ODT) 4 MG disintegrating tablet Take 1 tablet (4 mg total) by mouth every 8 (eight) hours as needed for nausea or vomiting. (Patient taking differently: Take 4 mg by mouth as  needed for nausea or vomiting. ) 18 tablet 1  . pantoprazole (PROTONIX) 40 MG tablet Take 1 tablet (40 mg total) by mouth daily. (Patient not taking: Reported on 08/21/2016) 30 tablet 0   No current facility-administered medications for this visit.     Allergies as of 08/21/2016  . (No Known Allergies)    Family History  Problem Relation Age of Onset  . Healthy Mother   . Heart failure Father   . Healthy Sister   . Healthy Brother   . Healthy Sister   . Cancer Maternal Uncle        unknown type  . Colon  cancer Neg Hx   . Stomach cancer Neg Hx   . Rectal cancer Neg Hx   . Esophageal cancer Neg Hx   . Liver cancer Neg Hx     Social History   Social History  . Marital status: Widowed    Spouse name: N/A  . Number of children: 0  . Years of education: N/A   Occupational History  . teacher    Social History Main Topics  . Smoking status: Never Smoker  . Smokeless tobacco: Never Used  . Alcohol use No  . Drug use: No  . Sexual activity: Not on file   Other Topics Concern  . Not on file   Social History Narrative  . No narrative on file    Review of Systems:    Constitutional: No fever or chills Skin: No rash Cardiovascular: No chest pain  Respiratory: No SOB  Gastrointestinal: See HPI and otherwise negative Genitourinary: No dysuria  Neurological: No headache Musculoskeletal: No new muscle or joint pain Hematologic: No bleeding Psychiatric: No history of depression or anxiety   Physical Exam:  Vital signs: BP 106/68   Pulse 72   Ht 5\' 7"  (1.702 m)   Wt 248 lb 6 oz (112.7 kg)   BMI 38.90 kg/m    Constitutional:   Pleasant obese Caucasian female appears to be in NAD, Well developed, Well nourished, alert and cooperative Head:  Normocephalic and atraumatic. Eyes:   PEERL, EOMI. No icterus. Conjunctiva pink. Ears:  Normal auditory acuity. Neck:  Supple Throat: Oral cavity and pharynx without inflammation, swelling or lesion.  Respiratory: Respirations even and unlabored. Lungs clear to auscultation bilaterally.   No wheezes, crackles, or rhonchi.  Cardiovascular: Normal S1, S2. No MRG. Regular rate and rhythm. No peripheral edema, cyanosis or pallor.  Gastrointestinal:  Soft, nondistended, nontender. No rebound or guarding. Normal bowel sounds. No appreciable masses or hepatomegaly. Rectal:  Not performed.  Msk:  Symmetrical without gross deformities. Without edema, no deformity or joint abnormality.  Neurologic:  Alert and  oriented x4;  grossly normal  neurologically.  Skin:   Dry and intact without significant lesions or rashes. Psychiatric: Demonstrates good judgement and reason without abnormal affect or behaviors.  See HPI for recent labs.  EXAM: ABDOMEN ULTRASOUND COMPLETE 08/05/16  COMPARISON:  None.  FINDINGS: Gallbladder: No gallstones. Sonographer described wall as minimally irregular. No gallbladder wall thickening, pericholecystic fluid or tenderness over this region during scanning per ultrasound technologist.  Common bile duct: Diameter: 2.6 mm  Liver: No focal lesion identified. Within normal limits in parenchymal echogenicity.  IVC: No abnormality visualized.  Pancreas: Suboptimally evaluated secondary to habitus and bowel gas. Portions visualized unremarkable.  Spleen: Size and appearance within normal limits.  Right Kidney: Length: 11 cm. Echogenicity within normal limits. No mass or hydronephrosis visualized.  Left Kidney: Length: 13 cm. Echogenicity  within normal limits. No mass or hydronephrosis visualized.  Abdominal aorta: No aneurysm visualized.  Other findings: None.  IMPRESSION: No gallstones or definitive findings of gallbladder abnormality.  Pancreas suboptimally evaluated secondary to bowel gas. The portions visualized are unremarkable.  Otherwise negative exam.   Electronically Signed   By: Lacy Duverney M.D.   On: 08/05/2016 13:30  Assessment: 1. Dyspepsia: 4-5 separate episodes over the past month, one landed her in the ER due to vomiting for a day, currently the patient is doing well on antireflux diet; consider PUD versus gastritis versus H. pylori versus other 2. Epigastric pain: With above 3. Diarrhea: With above 4. Screening for colorectal cancer: Patient is now 51 years old and due for colonoscopy.  Plan: 1. Scheduled patient for an EGD for further evaluation of her dyspepsia and epigastric pain. Discussed risks, benefits, limitations and alternatives and  the patient agrees to proceed. This is scheduled in [facility] with Dr. Adela Lank, as he is supervising physician this afternoon. 2. Reviewed antireflux diet and lifestyle modifications. Provided the patient with a handout. 3. Recommend the patient start her Pantoprazole 40 mg once daily, 30-60 minutes before eating breakfast. 4. We did discuss that the patient is due for a screening colonoscopy at this time, but we will wait on this as she is having acute symptoms of dyspepsia. We placed her in recall for the next 2-3 months. 5. Patient to follow in clinic per recommendations from Dr. Adela Lank after time of procedure.  Hyacinth Meeker, PA-C [facility] 08/21/2016, 1:18 PM  Cc: Elenora Gamma, MD

## 2016-08-25 ENCOUNTER — Other Ambulatory Visit: Payer: Self-pay | Admitting: Family Medicine

## 2016-08-25 ENCOUNTER — Encounter: Payer: Self-pay | Admitting: Gastroenterology

## 2016-08-25 ENCOUNTER — Ambulatory Visit (AMBULATORY_SURGERY_CENTER): Payer: BC Managed Care – PPO | Admitting: Gastroenterology

## 2016-08-25 VITALS — BP 123/72 | HR 58 | Temp 96.8°F | Resp 18 | Ht 67.0 in | Wt 248.0 lb

## 2016-08-25 DIAGNOSIS — B9681 Helicobacter pylori [H. pylori] as the cause of diseases classified elsewhere: Secondary | ICD-10-CM

## 2016-08-25 DIAGNOSIS — K2950 Unspecified chronic gastritis without bleeding: Secondary | ICD-10-CM

## 2016-08-25 DIAGNOSIS — R1013 Epigastric pain: Secondary | ICD-10-CM | POA: Diagnosis not present

## 2016-08-25 DIAGNOSIS — R112 Nausea with vomiting, unspecified: Secondary | ICD-10-CM | POA: Diagnosis not present

## 2016-08-25 MED ORDER — SODIUM CHLORIDE 0.9 % IV SOLN: 500.0000 mL | INTRAVENOUS | Status: DC

## 2016-08-25 NOTE — Progress Notes (Signed)
A and O x3. Report to RN. Tolerated MAC anesthesia well.Teeth unchanged after procedure.

## 2016-08-25 NOTE — Patient Instructions (Signed)
YOU HAD AN ENDOSCOPIC PROCEDURE TODAY AT THE [facility]:   Refer to the procedure report that was given to you for any specific questions about what was found during the examination.  If the procedure report does not answer your questions, please call your gastroenterologist to clarify.  If you requested that your care partner not be given the details of your procedure findings, then the procedure report has been included in a sealed envelope for you to review at your convenience later.  YOU SHOULD EXPECT: Some feelings of bloating in the abdomen. Passage of more gas than usual.  Walking can help get rid of the air that was put into your GI tract during the procedure and reduce the bloating. If you had a lower endoscopy (such as a colonoscopy or flexible sigmoidoscopy) you may notice spotting of blood in your stool or on the toilet paper. If you underwent a bowel prep for your procedure, you may not have a normal bowel movement for a few days.  Please Note:  You might notice some irritation and congestion in your nose or some drainage.  This is from the oxygen used during your procedure.  There is no need for concern and it should clear up in a day or so.  SYMPTOMS TO REPORT IMMEDIATELY:     Following upper endoscopy (EGD)  Vomiting of blood or coffee ground material  New chest pain or pain under the shoulder blades  Painful or persistently difficult swallowing  New shortness of breath  Fever of 100F or higher  Black, tarry-looking stools  For urgent or emergent issues, a gastroenterologist can be reached at any hour by calling [phone].   DIET:  We do recommend a small meal at first, but then you may proceed to your regular diet.  Drink plenty of fluids but you should avoid alcoholic beverages for 24 hours.  ACTIVITY:  You should plan to take it easy for the rest of today and you should NOT DRIVE or use heavy machinery until tomorrow (because of the sedation medicines  used during the test).    FOLLOW UP: Our staff will call the number listed on your records the next business day following your procedure to check on you and address any questions or concerns that you may have regarding the information given to you following your procedure. If we do not reach you, we will leave a message.  However, if you are feeling well and you are not experiencing any problems, there is no need to return our call.  We will assume that you have returned to your regular daily activities without incident.  If any biopsies were taken you will be contacted by phone or by letter within the next 1-3 weeks.  Please call us at [phone] if you have not heard about the biopsies in 3 weeks.    SIGNATURES/CONFIDENTIALITY: You and/or your care partner have signed paperwork which will be entered into your electronic medical record.  These signatures attest to the fact that that the information above on your After Visit Summary has been reviewed and is understood.  Full responsibility of the confidentiality of this discharge information lies with you and/or your care-partner.   Information on gastritis given to you today  Await biopsy results from Dr Adela LankArmbruster   Resume previous diet and medications   Trial of Protonix recommended as ordered by Dr Adela LankArmbruster

## 2016-08-25 NOTE — Op Note (Signed)
[facility] Patient Name: Brenda Hill Procedure Date: 08/25/2016 9:10 AM MRN: [id number] Endoscopist: Viviann Spare P. Naziah Portee MD, MD Age: 51 Referring MD:  Date of Birth: May 25, 1965 Gender: Female Account #: [id number] Procedure:                Upper GI endoscopy Indications:              Epigastric abdominal pain, Heartburn, Nausea with                            vomiting Medicines:                Monitored Anesthesia Care Procedure:                Pre-Anesthesia Assessment:                           - Prior to the procedure, a History and Physical                            was performed, and patient medications and                            allergies were reviewed. The patient's tolerance of                            previous anesthesia was also reviewed. The risks                            and benefits of the procedure and the sedation                            options and risks were discussed with the patient.                            All questions were answered, and informed consent                            was obtained. Prior Anticoagulants: The patient has                            taken no previous anticoagulant or antiplatelet                            agents. ASA Grade Assessment: II - A patient with                            mild systemic disease. After reviewing the risks                            and benefits, the patient was deemed in                            satisfactory condition to undergo the procedure.  After obtaining informed consent, the endoscope was                            passed under direct vision. Throughout the                            procedure, the patient's blood pressure, pulse, and                            oxygen saturations were monitored continuously. The                            Model GIF-HQ190 [id number]) scope was introduced                            through the mouth, and advanced to  the second part                            of duodenum. The upper GI endoscopy was                            accomplished without difficulty. The patient                            tolerated the procedure well. Scope In: Scope Out: Findings:                 Esophagogastric landmarks were identified: the                            Z-line was found at 37 cm, the gastroesophageal                            junction was found at 37 cm and the upper extent of                            the gastric folds was found at 38 cm from the                            incisors.                           A 1 cm hiatal hernia was present.                           The exam of the esophagus was otherwise normal.                           Patchy mildly erythematous mucosa was found                            superficially along the mucosa in the gastric                            fundus and  in the gastric body. Biopsies were taken                            with a cold forceps for histology and Helicobacter                            pylori testing.                           The exam of the stomach was otherwise normal.                           The duodenal bulb and second portion of the                            duodenum were normal. Biopsies for histology were                            taken with a cold forceps for evaluation of celiac                            disease. Complications:            No immediate complications. Estimated blood loss:                            Minimal. Estimated Blood Loss:     Estimated blood loss was minimal. Impression:               - Esophagogastric landmarks identified.                           - 1 cm hiatal hernia.                           - Normal esophagus otherwise                           - Erythematous mucosa in the gastric fundus and                            gastric body. Biopsied.                           - Normal duodenal bulb and second portion of  the                            duodenum. Biopsied. Recommendation:           - Patient has a contact number available for                            emergencies. The signs and symptoms of potential                            delayed complications were discussed with the  patient. Return to normal activities tomorrow.                            Written discharge instructions were provided to the                            patient.                           - Resume previous diet.                           - Continue present medications (trial of protonix                            as recommended)                           - Await pathology results with further                            recommendations Viviann SpareSteven P. Aldona Bryner MD, MD 08/25/2016 9:26:18 AM This report has been signed electronically.

## 2016-08-25 NOTE — Progress Notes (Signed)
Called to room to assist during endoscopic procedure.  Patient ID and intended procedure confirmed with present staff. Received instructions for my participation in the procedure from the performing physician.  

## 2016-08-26 ENCOUNTER — Telehealth: Payer: Self-pay

## 2016-08-26 NOTE — Telephone Encounter (Signed)
  Follow up Call-  Call back number 08/25/2016  Post procedure Call Back phone  # [phone]  Permission to leave phone message Yes     Patient questions:  Do you have a fever, pain , or abdominal swelling? No. Pain Score  0 *  Have you tolerated food without any problems? Yes.    Have you been able to return to your normal activities? Yes.    Do you have any questions about your discharge instructions: Diet   No. Medications  No. Follow up visit  No.  Do you have questions or concerns about your Care? No.  Actions: * If pain score is 4 or above: No action needed, pain <4.

## 2016-09-01 ENCOUNTER — Other Ambulatory Visit: Payer: Self-pay

## 2016-09-01 DIAGNOSIS — A048 Other specified bacterial intestinal infections: Secondary | ICD-10-CM

## 2016-09-01 MED ORDER — BIS SUBCIT-METRONID-TETRACYC 140-125-125 MG PO CAPS: 3.0000 | ORAL_CAPSULE | Freq: Three times a day (TID) | ORAL | 0 refills | Status: DC

## 2016-12-12 ENCOUNTER — Encounter: Payer: Self-pay | Admitting: Gastroenterology

## 2017-04-01 ENCOUNTER — Ambulatory Visit: Payer: BC Managed Care – PPO | Admitting: Family Medicine

## 2017-04-01 ENCOUNTER — Encounter: Payer: Self-pay | Admitting: Family Medicine

## 2017-04-01 VITALS — BP 107/68 | HR 63 | Temp 97.5°F | Ht 67.0 in | Wt 265.0 lb

## 2017-04-01 DIAGNOSIS — H8110 Benign paroxysmal vertigo, unspecified ear: Secondary | ICD-10-CM

## 2017-04-01 MED ORDER — MECLIZINE HCL 25 MG PO TABS: 25.0000 mg | ORAL_TABLET | Freq: Three times a day (TID) | ORAL | 0 refills | Status: DC | PRN

## 2017-04-01 NOTE — Patient Instructions (Signed)
Great to see you!  Try meclizine if needed  Check out the Youtube video for Epley's maneuver  Search for Epley's maneuver, watch the video by [facility]

## 2017-04-01 NOTE — Progress Notes (Signed)
   HPI  Patient presents today here for blood pressure check.  Patient states that she was at a rodeo over the weekend and states that when she stood up to walk on the stand she had a sudden episode of room spinning sensation and dizziness. But she states that she was immediately walked to an area where her blood pressure could be checked and her blood pressure was around 180 systolic. She had no chest pain or shortness of breath. Following day she had more sleepiness than usual and stated that she did actually have a headache off and on.  She has not had additional symptoms since that time. Her blood pressure was in the 140s systolic at a local drugstore on an automatic blood pressure machine.  PMH: Smoking status noted ROS: Per HPI  Objective: BP 107/68   Pulse 63   Temp (!) 97.5 F (36.4 C) (Oral)   Ht 5\' 7"  (1.702 m)   Wt 265 lb (120.2 kg)   BMI 41.50 kg/m  Gen: NAD, alert, cooperative with exam HEENT: NCAT CV: RRR, good S1/S2, no murmur Resp: CTABL, no wheezes, non-labored Ext: No edema, warm Neuro: Alert and oriented, No gross deficits  Assessment and plan:  #BPPV Patient most likely with episode of vertigo followed by response of hypertension due to stress. Her blood pressure is very well controlled today in the clinic I discussed Epley's maneuvers, meclizine Also discussed reasons to seek emergency medical help or return to the clinic.  Meds ordered this encounter  Medications  . meclizine (ANTIVERT) 25 MG tablet    Sig: Take 1 tablet (25 mg total) by mouth 3 (three) times daily as needed for dizziness.    Dispense:  30 tablet    Refill:  0    Murtis SinkSam Yarel Rushlow, MD [facility] 04/01/2017, 10:34 AM

## 2017-04-20 ENCOUNTER — Ambulatory Visit: Payer: BC Managed Care – PPO | Admitting: Physician Assistant

## 2017-04-20 ENCOUNTER — Encounter: Payer: Self-pay | Admitting: Physician Assistant

## 2017-04-20 VITALS — BP 121/72 | HR 55 | Temp 97.6°F | Ht 67.0 in | Wt 269.4 lb

## 2017-04-20 DIAGNOSIS — J111 Influenza due to unidentified influenza virus with other respiratory manifestations: Secondary | ICD-10-CM | POA: Diagnosis not present

## 2017-04-20 MED ORDER — OSELTAMIVIR PHOSPHATE 75 MG PO CAPS: 75.0000 mg | ORAL_CAPSULE | Freq: Two times a day (BID) | ORAL | 0 refills | Status: DC

## 2017-04-20 NOTE — Progress Notes (Signed)
BP 121/72   Pulse (!) 55   Temp 97.6 F (36.4 C) (Oral)   Ht 5\' 7"  (1.702 m)   Wt 269 lb 6.4 oz (122.2 kg)   BMI 42.19 kg/m    Subjective:    Patient ID: Brenda Hill, female    DOB: 12/31/1965, 52 y.o.   MRN: [id number]  HPI: Brenda Hill is a 52 y.o. female presenting on 04/20/2017 for Cough; Shortness of Breath; Headache; and Chills  This patient has had less than 2 days severe fever, chills, myalgias.  Complains of sinus headache and postnasal drainage. There is copious drainage at times. Associated sore throat, decreased appetite and headache.  Has been exposed to influenza.   Relevant past medical, surgical, family and social history reviewed and updated as indicated. Allergies and medications reviewed and updated.  Past Medical History:  Diagnosis Date  . History of pneumonia     Past Surgical History:  Procedure Laterality Date  . none      Review of Systems  Constitutional: Positive for chills, fatigue and fever. Negative for activity change and appetite change.  HENT: Positive for congestion, postnasal drip and sore throat.   Eyes: Negative.   Respiratory: Positive for cough and wheezing.   Cardiovascular: Negative.  Negative for chest pain, palpitations and leg swelling.  Gastrointestinal: Negative.   Genitourinary: Negative.   Musculoskeletal: Negative.   Skin: Negative.   Neurological: Positive for headaches.    Allergies as of 04/20/2017      Reactions   Meclizine Swelling   Ankle swelling       Medication List        Accurate as of 04/20/17 11:57 AM. Always use your most recent med list.          oseltamivir 75 MG capsule Commonly known as:  TAMIFLU Take 1 capsule (75 mg total) by mouth 2 (two) times daily.          Objective:    BP 121/72   Pulse (!) 55   Temp 97.6 F (36.4 C) (Oral)   Ht 5\' 7"  (1.702 m)   Wt 269 lb 6.4 oz (122.2 kg)   BMI 42.19 kg/m   Allergies  Allergen Reactions  . Meclizine Swelling    Ankle  swelling      Physical Exam  Constitutional: She is oriented to person, place, and time. She appears well-developed and well-nourished. No distress.  HENT:  Head: Normocephalic and atraumatic.  Right Ear: Tympanic membrane normal.  Left Ear: Tympanic membrane normal.  Nose: Mucosal edema and rhinorrhea present. Right sinus exhibits no frontal sinus tenderness. Left sinus exhibits no frontal sinus tenderness.  Mouth/Throat: Posterior oropharyngeal erythema present. No oropharyngeal exudate or tonsillar abscesses.  Eyes: Conjunctivae and EOM are normal. Pupils are equal, round, and reactive to light.  Neck: Normal range of motion.  Cardiovascular: Normal rate, regular rhythm, normal heart sounds, intact distal pulses and normal pulses.  Pulmonary/Chest: Effort normal and breath sounds normal. No respiratory distress.  Abdominal: Soft. Bowel sounds are normal.  Neurological: She is alert and oriented to person, place, and time. She has normal reflexes.  Skin: Skin is warm and dry. No rash noted.  Psychiatric: She has a normal mood and affect. Her behavior is normal. Judgment and thought content normal.  Nursing note and vitals reviewed.       Assessment & Plan:   1. Influenza - oseltamivir (TAMIFLU) 75 MG capsule; Take 1 capsule (75 mg total) by mouth 2 (two) times  daily.  Dispense: 10 capsule; Refill: 0    Current Outpatient Medications:  .  oseltamivir (TAMIFLU) 75 MG capsule, Take 1 capsule (75 mg total) by mouth 2 (two) times daily., Disp: 10 capsule, Rfl: 0  Current Facility-Administered Medications:  .  0.9 %  sodium chloride infusion, 500 mL, Intravenous, Continuous, Armbruster, Willaim Rayas, MD Continue all other maintenance medications as listed above.  Follow up plan: No Follow-up on file.  Educational handout given for survey  Remus Loffler PA-C [facility] [address]  [place], [place] [address] [phone]   04/20/2017, 11:57 AM

## 2017-04-20 NOTE — Patient Instructions (Signed)
In a few days you may receive a survey in the mail or online from [employer] regarding your visit with us today. Please take a moment to fill this out. Your feedback is very important to our whole office. It can help us better understand your needs as well as improve your experience and satisfaction. Thank you for taking your time to complete it. We care about you.  Iyauna Sing, PA-C  

## 2017-04-23 ENCOUNTER — Ambulatory Visit (INDEPENDENT_AMBULATORY_CARE_PROVIDER_SITE_OTHER): Payer: BC Managed Care – PPO

## 2017-04-23 ENCOUNTER — Encounter: Payer: Self-pay | Admitting: Family Medicine

## 2017-04-23 ENCOUNTER — Ambulatory Visit: Payer: BC Managed Care – PPO | Admitting: Family Medicine

## 2017-04-23 VITALS — BP 113/70 | HR 69 | Temp 96.7°F | Ht 67.0 in | Wt 267.2 lb

## 2017-04-23 DIAGNOSIS — J111 Influenza due to unidentified influenza virus with other respiratory manifestations: Secondary | ICD-10-CM

## 2017-04-23 DIAGNOSIS — R05 Cough: Secondary | ICD-10-CM

## 2017-04-23 DIAGNOSIS — R059 Cough, unspecified: Secondary | ICD-10-CM

## 2017-04-23 MED ORDER — METHYLPREDNISOLONE ACETATE 80 MG/ML IJ SUSP
80.0000 mg | Freq: Once | INTRAMUSCULAR | Status: AC
  Administered 2017-04-23: 80 mg via INTRAMUSCULAR

## 2017-04-23 MED ORDER — HYDROCODONE-HOMATROPINE 5-1.5 MG/5ML PO SYRP: 5.0000 mL | ORAL_SOLUTION | Freq: Four times a day (QID) | ORAL | 0 refills | Status: DC | PRN

## 2017-04-23 MED ORDER — ALBUTEROL SULFATE HFA 108 (90 BASE) MCG/ACT IN AERS: 2.0000 | INHALATION_SPRAY | Freq: Four times a day (QID) | RESPIRATORY_TRACT | 0 refills | Status: DC | PRN

## 2017-04-23 NOTE — Progress Notes (Signed)
   HPI  Patient presents today for cough.  Patient was seen 3 days ago and diagnosed with influenza.  She is been doing well with Tamiflu and states that her body aches and fevers are improving quite a bit.  She states that however her cough is not improving and she still has significant shortness of breath.  She is tolerating food and fluids like usual.  No fevers.  She has done well with albuterol previously  PMH: Smoking status noted ROS: Per HPI  Objective: BP 113/70   Pulse 69   Temp (!) 96.7 F (35.9 C) (Oral)   Ht 5\' 7"  (1.702 m)   Wt 267 lb 4 oz (121.2 kg)   BMI 41.86 kg/m  Gen: NAD, alert, cooperative with exam HEENT: NCAT CV: RRR, good S1/S2, no murmur Resp: CTABL, no wheezes, non-labored Ext: No edema, warm Neuro: Alert and oriented, No gross deficits  Assessment and plan:  #Cough, influenza Influenza related cough most likely, given Hycodan cough syrup, albuterol, and IM Depo-Medrol. Given that she had improvement transiently and then worsening of also ordered a plain film which is pending. ( clear on my read)   Orders Placed This Encounter  Procedures  . DG Chest 2 View    Standing Status:   Future    Standing Expiration Date:   06/22/2018    Order Specific Question:   Reason for Exam (SYMPTOM  OR DIAGNOSIS REQUIRED)    Answer:   cough, post flu    Order Specific Question:   Is patient pregnant?    Answer:   No    Order Specific Question:   Preferred imaging location?    Answer:   Internal    Order Specific Question:   Radiology Contrast Protocol - do NOT remove file path    Answer:   \\[email or web address]\DXFluoroContrastProtocols.pdf    Meds ordered this encounter  Medications  . HYDROcodone-homatropine (HYCODAN) 5-1.5 MG/5ML syrup    Sig: Take 5 mLs by mouth every 6 (six) hours as needed for cough.    Dispense:  120 mL    Refill:  0  . albuterol (PROVENTIL HFA;VENTOLIN HFA) 108 (90 Base) MCG/ACT inhaler    Sig: Inhale 2 puffs into the  lungs every 6 (six) hours as needed for wheezing or shortness of breath.    Dispense:  1 Inhaler    Refill:  0    Murtis SinkSam Bradshaw, MD [facility] 04/23/2017, 12:11 PM

## 2017-04-30 ENCOUNTER — Telehealth: Payer: Self-pay | Admitting: Family Medicine

## 2017-04-30 NOTE — Telephone Encounter (Signed)
What symptoms do you have? No better and finished tamiflu  How long have you been sick? 04/23/17  Have you been seen for this problem? 04/20/17 with the flu and 04/23/17 flu  If your provider decides to give you a prescription, which pharmacy would you like for it to be sent to? [facility] [place]   Patient informed that this information will be sent to the clinical staff for review and that they should receive a follow up call.

## 2017-05-01 MED ORDER — BENZONATATE 100 MG PO CAPS: 100.0000 mg | ORAL_CAPSULE | Freq: Three times a day (TID) | ORAL | 0 refills | Status: DC | PRN

## 2017-05-01 MED ORDER — PREDNISONE 10 MG PO TABS: ORAL_TABLET | ORAL | 0 refills | Status: DC

## 2017-05-01 NOTE — Telephone Encounter (Signed)
Patient aware and verbalizes understanding. 

## 2017-05-01 NOTE — Telephone Encounter (Signed)
lmtcb

## 2017-05-01 NOTE — Telephone Encounter (Signed)
I have sent in a long prednisone taper and tessalon. If hs e is getting worse we should be careful to recheck for pneumonia.   Murtis SinkSam Claudie Brickhouse, MD [facility] 05/01/2017, 11:54 AM

## 2017-05-01 NOTE — Telephone Encounter (Signed)
Patient was seen 2/14- has finished her tamiflu and still taking her hycodan.  C/o cough, sob, headache and nasal congestion.  States she is coughing so much that it is giving her SOB. Please advise .

## 2017-05-12 ENCOUNTER — Ambulatory Visit: Payer: BC Managed Care – PPO | Admitting: Family Medicine

## 2017-05-12 ENCOUNTER — Encounter: Payer: Self-pay | Admitting: Family Medicine

## 2017-05-12 VITALS — BP 118/80 | HR 80 | Temp 98.0°F | Ht 67.0 in | Wt 268.6 lb

## 2017-05-12 DIAGNOSIS — R059 Cough, unspecified: Secondary | ICD-10-CM

## 2017-05-12 DIAGNOSIS — R05 Cough: Secondary | ICD-10-CM | POA: Diagnosis not present

## 2017-05-12 MED ORDER — AMOXICILLIN-POT CLAVULANATE 875-125 MG PO TABS: 1.0000 | ORAL_TABLET | Freq: Two times a day (BID) | ORAL | 0 refills | Status: DC

## 2017-05-12 MED ORDER — BUDESONIDE-FORMOTEROL FUMARATE 160-4.5 MCG/ACT IN AERO: 2.0000 | INHALATION_SPRAY | Freq: Two times a day (BID) | RESPIRATORY_TRACT | 0 refills | Status: DC

## 2017-05-12 MED ORDER — TRIAMCINOLONE ACETONIDE 40 MG/ML IJ SUSP
40.0000 mg | Freq: Once | INTRAMUSCULAR | Status: AC
  Administered 2017-05-12: 40 mg via INTRAMUSCULAR

## 2017-05-12 NOTE — Patient Instructions (Signed)
Great to see you!   

## 2017-05-12 NOTE — Progress Notes (Signed)
   HPI  Patient presents today here with cough.  Patient explains that she has had cough now for about 2-3 weeks.  She had influenza-like illness first seen on 04/20/2017. She states that she did improve with Depo-Medrol, also with prednisone.  Albuterol is moderately helpful.  She states that she continues to have severe deep cough. She also has right lower chest pain whenever the cough comes.  PMH: Smoking status noted ROS: Per HPI  Objective: BP 118/80   Pulse 80   Temp 98 F (36.7 C) (Oral)   Ht 5\' 7"  (1.702 m)   Wt 268 lb 9.6 oz (121.8 kg)   SpO2 98%   BMI 42.07 kg/m  Gen: NAD, alert, cooperative with exam HEENT: NCAT, nares clear, TMs normal bilaterally CV: RRR, good S1/S2, no murmur Resp: CTABL, no wheezes, non-labored Ext: No edema, warm Neuro: Alert and oriented, No gross deficits  Assessment and plan:  #Cough Most likely post viral cough, however patient does seem to be getting worse. We will go ahead and cover with Augmentin, given another injection of cortisone-IM Kenalog. Trial of symbicort- samples   Meds ordered this encounter  Medications  . amoxicillin-clavulanate (AUGMENTIN) 875-125 MG tablet    Sig: Take 1 tablet by mouth 2 (two) times daily.    Dispense:  20 tablet    Refill:  0  . budesonide-formoterol (SYMBICORT) 160-4.5 MCG/ACT inhaler    Sig: Inhale 2 puffs into the lungs 2 (two) times daily.    Dispense:  2 Inhaler    Refill:  0  . triamcinolone acetonide (KENALOG-40) injection 40 mg    Murtis SinkSam Bradshaw, MD [facility] 05/12/2017, 9:28 AM

## 2017-05-13 ENCOUNTER — Encounter: Payer: Self-pay | Admitting: Family Medicine

## 2017-05-14 ENCOUNTER — Telehealth: Payer: Self-pay | Admitting: Family Medicine

## 2017-05-14 NOTE — Telephone Encounter (Signed)
Ok With note for work.    [name], MD [facility] 05/14/2017, 10:12 AM

## 2017-05-14 NOTE — Telephone Encounter (Signed)
Letter done- patient aware and states she will print off of mychart.

## 2017-05-14 NOTE — Telephone Encounter (Signed)
Please review and advise.

## 2017-05-21 ENCOUNTER — Encounter: Payer: Self-pay | Admitting: Family Medicine

## 2017-05-21 ENCOUNTER — Other Ambulatory Visit: Payer: Self-pay | Admitting: Family Medicine

## 2017-05-21 ENCOUNTER — Ambulatory Visit: Payer: BC Managed Care – PPO | Admitting: Family Medicine

## 2017-05-21 ENCOUNTER — Ambulatory Visit (INDEPENDENT_AMBULATORY_CARE_PROVIDER_SITE_OTHER): Payer: BC Managed Care – PPO

## 2017-05-21 VITALS — BP 124/71 | HR 77 | Temp 97.5°F | Ht 67.0 in | Wt 260.8 lb

## 2017-05-21 DIAGNOSIS — R059 Cough, unspecified: Secondary | ICD-10-CM

## 2017-05-21 DIAGNOSIS — R05 Cough: Secondary | ICD-10-CM | POA: Diagnosis not present

## 2017-05-21 MED ORDER — HYDROCODONE-HOMATROPINE 5-1.5 MG/5ML PO SYRP: 5.0000 mL | ORAL_SOLUTION | Freq: Four times a day (QID) | ORAL | 0 refills | Status: DC | PRN

## 2017-05-21 MED ORDER — PREDNISONE 10 MG PO TABS: ORAL_TABLET | ORAL | 0 refills | Status: DC

## 2017-05-21 NOTE — Progress Notes (Signed)
   HPI  Patient presents today with persistent cough.  Patient has had almost 5 weeks of illness now.  She was initially treated for influenza. She was reassured on the second visit and has been given IM Depo-Medrol and 2 different occasions. She has been treated with Augmentin on her last visit with mild improvement, also given Symbicort to try, also albuterol all with mild improvement.  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 She states that she had a Tdap about 2 years ago.  She denies fever, chills, sweats. She has a severe cough.  PMH: Smoking status noted ROS: Per HPI  Objective: BP 124/71   Pulse 77   Temp (!) 97.5 F (36.4 C) (Oral)   Ht 5\' 7"  (1.702 m)   Wt 260 lb 12.8 oz (118.3 kg)   SpO2 97%   BMI 40.85 kg/m  Gen: NAD, alert, cooperative with exam HEENT: NCAT, EOMI, PERRL CV: RRR, good S1/S2, no murmur Resp: Persistent cough, good air movement, expiratory wheezes Ext: No edema, warm Neuro: Alert and oriented, No gross deficits  Assessment and plan:  #Cough Very persistent cough, patient now with cough for approximately 5 weeks Respiratory panel including pertussis She has been treated with IM Depo-Medrol x2, Augmentin, Tessalon Perles, Hycodan, Symbicort, albuterol. Refill Hycodan, 12-day prednisone course Plain film Refer to pulmonology, she has had one episode previously with a very severe post infectious cough that seems unusual.  Consider underlying chronic lung disease    Orders Placed This Encounter  Procedures  . DG Chest 2 View    Standing Status:   Future    Number of Occurrences:   1    Standing Expiration Date:   07/22/2018    Order Specific Question:   Reason for Exam (SYMPTOM  OR DIAGNOSIS REQUIRED)    Answer:   cough    Order Specific Question:   Is patient pregnant?    Answer:   No    Order Specific Question:   Preferred imaging location?    Answer:   Internal    Order Specific Question:   Radiology Contrast Protocol - do NOT  remove file path    Answer:   \\[email or web address]\DXFluoroContrastProtocols.pdf  . Ambulatory referral to Pulmonology    Referral Priority:   Routine    Referral Type:   Consultation    Referral Reason:   Specialty Services Required    Requested Specialty:   Pulmonary Disease    Number of Visits Requested:   1    Meds ordered this encounter  Medications  . predniSONE (DELTASONE) 10 MG tablet    Sig: Take 4 pills a day for 3 days, then 3 pills a day for 3 days, then 2 pills a day for 3 days, then 1 pill a day for 3 days, then stop    Dispense:  30 tablet    Refill:  0  . HYDROcodone-homatropine (HYCODAN) 5-1.5 MG/5ML syrup    Sig: Take 5 mLs by mouth every 6 (six) hours as needed for cough.    Dispense:  120 mL    Refill:  0    Murtis SinkSam Yumi Insalaco, MD [facility] 05/21/2017, 9:26 AM

## 2017-05-21 NOTE — Patient Instructions (Signed)
Great to see you!   

## 2017-05-22 ENCOUNTER — Telehealth: Payer: Self-pay | Admitting: Family Medicine

## 2017-05-22 NOTE — Telephone Encounter (Signed)
Refer to lab note °

## 2017-05-22 NOTE — Telephone Encounter (Signed)
Lab not back yet- patient aware

## 2017-05-25 LAB — RESPIRATORY PROFILE, PCR
ADENOVIRUS: NOT DETECTED
Bordetella pertussis: NOT DETECTED
CORONAVIRUS 229E: NOT DETECTED
CORONAVIRUS OC43: NOT DETECTED
Chlamydophila pneumoniae: NOT DETECTED
Coronavirus HKU1: NOT DETECTED
Coronavirus NL63: NOT DETECTED
HUMAN METAPNEUMOVIRUS: NOT DETECTED
Human Rhinovirus/Enterovirus: NOT DETECTED
INFLUENZA A/H1: NOT DETECTED
INFLUENZA A: NOT DETECTED
INFLUENZA B: NOT DETECTED
Influenza A/H1-2009: NOT DETECTED
Influenza A/H3: NOT DETECTED
Mycoplasma pneumoniae: NOT DETECTED
PARAINFLUENZA 1: NOT DETECTED
PARAINFLUENZA 2: NOT DETECTED
PARAINFLUENZA 3: NOT DETECTED
Parainfluenza 4: NOT DETECTED
RESPIRATORY SYNCYTIAL VIRUS: NOT DETECTED

## 2017-05-28 ENCOUNTER — Telehealth: Payer: Self-pay | Admitting: Family Medicine

## 2017-05-28 NOTE — Telephone Encounter (Signed)
She is scheduled for 06/19/17

## 2017-05-28 NOTE — Telephone Encounter (Signed)
Pt checking status of pulmonary referral

## 2017-05-29 NOTE — Telephone Encounter (Signed)
Pt notified of referral Pt seen in ER last PM Pt is better today She will call back if symptoms worsen

## 2017-06-01 ENCOUNTER — Ambulatory Visit: Payer: BC Managed Care – PPO | Admitting: Family Medicine

## 2017-06-01 ENCOUNTER — Encounter: Payer: Self-pay | Admitting: Family Medicine

## 2017-06-01 VITALS — BP 111/75 | HR 68 | Temp 97.4°F | Ht 67.0 in | Wt 266.4 lb

## 2017-06-01 DIAGNOSIS — R0600 Dyspnea, unspecified: Secondary | ICD-10-CM | POA: Diagnosis not present

## 2017-06-01 NOTE — Progress Notes (Signed)
   HPI  Patient presents today here for follow-up shortness of breath and cough.  Patient was seen on February 11 with flulike illness and treated with Tamiflu.   summary recent visits 04/23/2017 for cough given albuterol and Hycodan and plain film normal,   05/12/2017 for persistent cough, given Augmentin, Symbicort, IM Kenalog, 05/21/2017 with persistent cough, given Hycodan and 12-day prednisone course, plain film normal  Patient now states that her cough has improved, however she is developed interval worsening of shortness of breath.  It was so severe that she went to the hospital on the 21st and was evaluated with x-rays and blood work including d-dimer which was negative. Troponin and EKG were also normal.  She has been scheduled to see pulmonology who has recently changed her appointment date and will see her 3 days from now.   PMH: Smoking status noted ROS: Per HPI  Objective: BP 111/75   Pulse 68   Temp (!) 97.4 F (36.3 C) (Oral)   Ht 5\' 7"  (1.702 m)   Wt 266 lb 6.4 oz (120.8 kg)   SpO2 97%   BMI 41.72 kg/m  Gen: NAD, alert, cooperative with exam HEENT: NCAT CV: RRR, good S1/S2, no murmur Resp: CTABL, no wheezes, non-labored Abd: SNTND, BS present, no guarding or organomegaly Ext: No edema, warm Neuro: Alert and oriented, No gross deficits  Assessment and plan:  #dysonea Unclear etiology, cough and dyspnea developed after influenza. Patient has been treated with multiple different attempts for improvement. Currently cough is improved quite a bit but now she is having episodes of shortness of breath. D-dimer has recently been negative in the emergency room. Patient is seeing pulmonology in 3 days. She has since been treated with IM steroids x2, Augmentin, Tessalon Perles, Hycodan, Symbicort, and albuterol.  She has had improvement of cough after 12-day course of prednisone but has new interval development of shortness of breath. Appreciate their  recommendations    Murtis SinkSam Deyra Perdomo, MD [facility] 06/01/2017, 2:21 PM

## 2017-06-04 ENCOUNTER — Encounter: Payer: Self-pay | Admitting: Internal Medicine

## 2017-06-04 ENCOUNTER — Ambulatory Visit: Payer: BC Managed Care – PPO | Admitting: Internal Medicine

## 2017-06-04 VITALS — BP 106/80 | HR 70 | Ht 67.0 in | Wt 264.0 lb

## 2017-06-04 DIAGNOSIS — R05 Cough: Secondary | ICD-10-CM | POA: Diagnosis not present

## 2017-06-04 DIAGNOSIS — R058 Other specified cough: Secondary | ICD-10-CM

## 2017-06-04 MED ORDER — PANTOPRAZOLE SODIUM 40 MG PO TBEC: 40.0000 mg | DELAYED_RELEASE_TABLET | Freq: Every day | ORAL | 2 refills | Status: AC

## 2017-06-04 MED ORDER — FAMOTIDINE 20 MG PO TABS: ORAL_TABLET | ORAL | 2 refills | Status: DC

## 2017-06-04 MED ORDER — PREDNISONE 10 MG PO TABS: ORAL_TABLET | ORAL | 0 refills | Status: DC

## 2017-06-04 MED ORDER — HYDROCODONE-HOMATROPINE 5-1.5 MG/5ML PO SYRP: 5.0000 mL | ORAL_SOLUTION | ORAL | 0 refills | Status: AC | PRN

## 2017-06-04 NOTE — Progress Notes (Signed)
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   06/04/2017 1st [facility] office visit/ Brenda Hill   Chief Complaint  Patient presents with  . Pulmonary Consult    Referred by Dr. Ermalinda Hill. Pt c/o cough for the past month. She started having SOB a wk ago.  She states that when she wakes up in the am she feels. Her cough is non prod and sometimes wakes her up in the night.   cough was improving p peaked in mid feb but seemed to be better before   tapering off prednisone and flared flared while on 10 mg per day  and maint on symb   Cough tended to be worse day than sleeping/ worse as the morning progressed and ? Peaked around 10 am  No better with albuterol  Mostly sob just when coughing but also some with exertion   Has h/o HH s gerd finding at [facility] 08/25/16 while on ppi which was subsequently d/c'd  No obvious day to day or daytime variability or assoc excess/ purulent sputum or mucus plugs or hemoptysis or cp or chest tightness, subjective wheeze or overt sinus or hb symptoms. No unusual exposure hx or h/o childhood pna/ asthma or knowledge of premature birth.  Sleeping ok flat without nocturnal  or early am exacerbation  of respiratory  c/o's or need for noct saba. Also denies any obvious fluctuation of symptoms with weather or environmental changes or other aggravating or alleviating factors except as outlined above   Current Allergies, Complete Past Medical History, Past Surgical History, Family History, and Social History were reviewed in [facility] electronic medical record.  ROS  The following are not active  complaints unless bolded Hoarseness, sore throat, dysphagia, dental problems, itching, sneezing,  nasal congestion or discharge of excess mucus or purulent secretions, ear ache,   fever, chills, sweats, unintended wt loss or wt gain, classically pleuritic or exertional cp,  orthopnea pnd or leg swelling, presyncope, palpitations, abdominal pain, anorexia, nausea, vomiting, diarrhea  or change in bowel habits or change in bladder habits, change in stools or change in urine, dysuria, hematuria,  rash, arthralgias, visual complaints, headache, numbness, weakness or ataxia or problems with walking or coordination,  change in mood/affect or memory.        Current Meds  Medication Sig  . HYDROcodone-homatropine (HYCODAN) 5-1.5 MG/5ML syrup Take 5 mLs by mouth every 4 (four) hours as needed for cough.  . [DISCONTINUED] albuterol (PROVENTIL HFA;VENTOLIN HFA) 108 (90 Base) MCG/ACT inhaler Inhale 2 puffs into the lungs every 6 (six) hours as needed for wheezing or shortness of breath.  . [DISCONTINUED] budesonide-formoterol (SYMBICORT) 160-4.5 MCG/ACT inhaler Inhale 2 puffs into the lungs 2 (two) times daily.  . [DISCONTINUED] HYDROcodone-homatropine (HYCODAN) 5-1.5 MG/5ML syrup Take 5 mLs by mouth every 6 (six) hours as needed for cough.           Review of Systems     Objective:   Physical Exam    pleasant amb wf nad   Wt Readings from Last 3 Encounters:  06/04/17  264 lb (119.7 kg)  06/01/17 266 lb 6.4 oz (120.8 kg)  05/21/17 260 lb 12.8 oz (118.3 kg)     Vital signs reviewed - Note on arrival 02 sats  98% on RA      HEENT: nl dentition, turbinates bilaterally, and oropharynx. Nl external ear canals without cough reflex   NECK :  without JVD/Nodes/TM/ nl carotid upstrokes bilaterally   LUNGS: no acc muscle use,  Nl contour chest which is clear to A and P bilaterally without cough on insp or exp maneuvers   CV:  RRR  no s3 or murmur or increase in P2, and no edema   ABD:  soft  and nontender with nl inspiratory excursion in the supine position. No bruits or organomegaly appreciated, bowel sounds nl  MS:  Nl gait/ ext warm without deformities, calf tenderness, cyanosis or clubbing No obvious joint restrictions   SKIN: warm and dry without lesions    NEURO:  alert, approp, nl sensorium with  no motor or cerebellar deficits apparent.     I personally reviewed images and agree with radiology impression as follows:  CXR:   05/21/17 No active cardiopulmonary disease.  Assessment:

## 2017-06-04 NOTE — Patient Instructions (Addendum)
Prednisone 10 mg take  4 each am x 2 days,   2 each am x 2 days,  1 each am x 2 days and stop   Pantoprazole (protonix) 40 mg   Take  30-60 min before first meal of the day and Pepcid (famotidine)  20 mg one @  bedtime until return to office - this is the best way to tell whether stomach acid is contributing to your problem.  .   Take when you can take enough cough syrup to stop all coughing thru Sunday night the 31st    GERD (REFLUX)  is an extremely common cause of respiratory symptoms just like yours , many times with no obvious heartburn at all.    It can be treated with medication, but also with lifestyle changes including elevation of the head of your bed (ideally with 6 inch  bed blocks),  Smoking cessation, avoidance of late meals, excessive alcohol, and avoid fatty foods, chocolate, peppermint, colas, red wine, and acidic juices such as orange juice.  NO MINT OR MENTHOL PRODUCTS SO NO COUGH DROPS   USE SUGARLESS CANDY INSTEAD (Jolley ranchers or Stover's or Life Savers) or even ice chips will also do - the key is to swallow to prevent all throat clearing. NO OIL BASED VITAMINS - use powdered substitutes.   Return in 2 weeks if not 100% better                .

## 2017-06-07 ENCOUNTER — Encounter: Payer: Self-pay | Admitting: Internal Medicine

## 2017-06-07 DIAGNOSIS — R058 Other specified cough: Secondary | ICD-10-CM | POA: Insufficient documentation

## 2017-06-07 DIAGNOSIS — R05 Cough: Secondary | ICD-10-CM | POA: Insufficient documentation

## 2017-06-07 NOTE — Assessment & Plan Note (Signed)
The most common causes of chronic cough in immunocompetent adults include the following: upper airway cough syndrome (UACS), previously referred to as postnasal drip syndrome (PNDS), which is caused by variety of rhinosinus conditions; (2) asthma; (3) GERD; (4) chronic bronchitis from cigarette smoking or other inhaled environmental irritants; (5) nonasthmatic eosinophilic bronchitis; and (6) bronchiectasis.   These conditions, singly or in combination, have accounted for up to 94% of the causes of chronic cough in prospective studies.   Other conditions have constituted no >6% of the causes in prospective studies These have included bronchogenic carcinoma, chronic interstitial pneumonia, sarcoidosis, left ventricular failure, ACEI-induced cough, and aspiration from a condition associated with pharyngeal dysfunction.    Chronic cough is often simultaneously caused by more than one condition. A single cause has been found from 38 to 82% of the time, multiple causes from 18 to 62%. Multiply caused cough has been the result of three diseases up to 42% of the time.       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   These are the same pts (now being labeled as having "irritable larynx syndrome" by some cough centers) who not infrequently have a history of having failed to tolerate ace inhibitors,  dry powder inhalers or biphosphonates or report having atypical/extraesophageal reflux symptoms that don't respond to standard doses of PPI  and are easily  confused as having aecopd or asthma flares by even experienced allergists/ pulmonologists (myself included).   Of the three most common causes of  Sub-acute or recurrent or chronic cough, only one (GERD)  can actually contribute to/ trigger  the other two (asthma and post nasal drip syndrome)  and perpetuate the cylce of cough.  While not intuitively obvious, many patients with chronic low grade reflux do not cough until there is a primary insult that disturbs the protective epithelial barrier and exposes sensitive nerve endings.   This is typically viral but can be direct physical injury such as with an endotracheal tube.      The point is that once this occurs, it is difficult to eliminate the cycle  using anything but a maximally effective acid suppression regimen at least in the short run, accompanied by an appropriate diet to address non acid GERD and control / eliminate the cough itself for at least 3 days with hycodan since she already has it.  Will try off symbicort and repeat the pred taper this time while on max gerd rx then return if not better   Reviewed with pt: The standardized cough guidelines published in Chest by [employer] in 2006 are still the best available and consist of a multiple step process (up to 12!) , not a single office visit,  and are intended  to address this problem logically,  with an alogrithm dependent on response to empiric treatment at  each progressive step  to determine a specific diagnosis with  minimal addtional testing needed. Therefore if adherence is an issue or can't be accurately verified,  it's very unlikely the standard evaluation  and treatment will be successful here.    Furthermore, response to therapy (other than acute cough suppression, which should only be used short term with avoidance of narcotic containing cough syrups if possible), can be a gradual process for which the patient is not likely to  perceive immediate benefit.  Unlike going to  an eye doctor where the best perscription is almost always the first one and is immediately effective, this is almost never the case in the management of chronic cough syndromes. Therefore the patient needs to commit up front to consistently adhere to recommendations  for up to 6 weeks of therapy directed at the likely underlying problem(s) before the response can be reasonably evaluated.    Total time devoted to counseling  > 50 % of initial 60 min office visit:  review case with pt/ discussion of options/alternatives/ personally creating written customized instructions  in presence of pt  then going over those specific  Instructions directly with the pt including how to use all of the meds but in particular covering each new medication in detail and the difference between the maintenance= "automatic" meds and the prns using an action plan format for the latter (If this problem/symptom => do that organization reading Left to right).  Please see AVS from this visit for a full list of these instructions which I personally wrote for this pt and  are unique to this visit.

## 2017-06-18 ENCOUNTER — Ambulatory Visit: Payer: BC Managed Care – PPO | Admitting: Internal Medicine

## 2017-06-18 ENCOUNTER — Encounter: Payer: Self-pay | Admitting: Internal Medicine

## 2017-06-18 ENCOUNTER — Other Ambulatory Visit (INDEPENDENT_AMBULATORY_CARE_PROVIDER_SITE_OTHER): Payer: BC Managed Care – PPO

## 2017-06-18 ENCOUNTER — Encounter: Payer: Self-pay | Admitting: *Deleted

## 2017-06-18 VITALS — BP 118/80 | HR 52 | Ht 67.0 in | Wt 265.6 lb

## 2017-06-18 DIAGNOSIS — R0609 Other forms of dyspnea: Secondary | ICD-10-CM

## 2017-06-18 DIAGNOSIS — R05 Cough: Secondary | ICD-10-CM

## 2017-06-18 DIAGNOSIS — R058 Other specified cough: Secondary | ICD-10-CM

## 2017-06-18 LAB — CBC WITH DIFFERENTIAL/PLATELET
Basophils Absolute: 0 10*3/uL (ref 0.0–0.1)
Basophils Relative: 0.4 % (ref 0.0–3.0)
Eosinophils Absolute: 0.1 10*3/uL (ref 0.0–0.7)
Eosinophils Relative: 0.9 % (ref 0.0–5.0)
HCT: 37 % (ref 36.0–46.0)
HEMOGLOBIN: 12.2 g/dL (ref 12.0–15.0)
Lymphocytes Relative: 17.7 % (ref 12.0–46.0)
Lymphs Abs: 1.2 10*3/uL (ref 0.7–4.0)
MCHC: 33 g/dL (ref 30.0–36.0)
MCV: 87.4 fl (ref 78.0–100.0)
MONO ABS: 0.5 10*3/uL (ref 0.1–1.0)
Monocytes Relative: 7.2 % (ref 3.0–12.0)
Neutro Abs: 5.1 10*3/uL (ref 1.4–7.7)
Neutrophils Relative %: 73.8 % (ref 43.0–77.0)
Platelets: 273 10*3/uL (ref 150.0–400.0)
RBC: 4.24 Mil/uL (ref 3.87–5.11)
RDW: 15 % (ref 11.5–15.5)
WBC: 6.9 10*3/uL (ref 4.0–10.5)

## 2017-06-18 LAB — BRAIN NATRIURETIC PEPTIDE: PRO B NATRI PEPTIDE: 43 pg/mL (ref 0.0–100.0)

## 2017-06-18 LAB — BASIC METABOLIC PANEL
BUN: 8 mg/dL (ref 6–23)
CALCIUM: 8.6 mg/dL (ref 8.4–10.5)
CO2: 27 meq/L (ref 19–32)
Chloride: 106 mEq/L (ref 96–112)
Creatinine, Ser: 0.66 mg/dL (ref 0.40–1.20)
GFR: 100.37 mL/min (ref >60.00)
Glucose, Bld: 87 mg/dL (ref 70–99)
Potassium: 3.8 mEq/L (ref 3.5–5.1)
SODIUM: 140 meq/L (ref 135–145)

## 2017-06-18 MED ORDER — BUDESONIDE-FORMOTEROL FUMARATE 80-4.5 MCG/ACT IN AERO: 2.0000 | INHALATION_SPRAY | Freq: Two times a day (BID) | RESPIRATORY_TRACT | 0 refills | Status: DC

## 2017-06-18 NOTE — Progress Notes (Signed)
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   History of Present Illness  06/04/2017 1st [facility] office visit/ Brenda Hill   Chief Complaint  Patient presents with  . Pulmonary Consult    Referred by Dr. Ermalinda MemosBradshaw. Pt c/o cough for the past month. She started having SOB a wk ago.  She states that when she wakes up in the am she feels. Her cough is non prod and sometimes wakes her up in the night.   cough was improving p peaked in mid feb but seemed to be better before   tapering off prednisone and flared flared while on 10 mg per day  and maint on symb   Cough tended to be worse day than sleeping/ worse as the morning progressed and ? Peaked around 10 am  No better with albuterol  Mostly sob just when coughing but also some with exertion   Has h/o HH s gerd finding at [facility] 08/25/16 while on ppi which was subsequently d/c'd rec Prednisone 10 mg take  4 each am x 2 days,   2 each am x 2 days,  1 each am x 2 days and stop  Pantoprazole (protonix) 40 mg   Take  30-60 min before first meal of the day and Pepcid (famotidine)  20 mg one @  bedtime until return to office   GERD diet   06/18/2017  f/u ov/Brenda Hill re: uacs resolved  Chief Complaint  Patient presents with  . Follow-up    Breathing has improved some but still not back to her normal baseline.   Dyspnea:  Sometimes when over does it   Cough: gone Sleep: fine flat SABA use:  Never any more   No obvious day to day or daytime variability or assoc excess/ purulent sputum or mucus plugs or hemoptysis or cp or chest tightness, subjective wheeze  or overt sinus or hb symptoms. No unusual exposure hx or h/o childhood pna/ asthma or knowledge of premature birth.  Sleeping  Ok flat   without nocturnal  or early am exacerbation  of respiratory  c/o's or need for noct saba. Also denies any obvious fluctuation of symptoms with weather or environmental changes or other aggravating or alleviating factors except as outlined above   Current Allergies, Complete Past Medical History, Past Surgical History, Family History, and Social History were [facility] electronic [facility].  ROS  The following are not active complaints unless bolded Hoarseness, sore throat, dysphagia, dental problems, itching, sneezing,  nasal congestion or discharge of excess mucus or purulent secretions, ear ache,   fever, chills, sweats, unintended wt loss or wt gain, classically pleuritic or exertional cp,  orthopnea pnd or arm/hand swelling  or leg swelling, presyncope, palpitations, abdominal pain, anorexia, nausea, vomiting, diarrhea  or change in bowel habits or change in bladder habits, change in stools or change in urine, dysuria, hematuria,  rash, arthralgias, visual complaints, headache, numbness, weakness or ataxia or problems with walking or coordination,  change in mood or  memory.  Current Meds  Medication Sig  . famotidine (PEPCID) 20 MG tablet One at bedtime  . HYDROcodone-homatropine (HYCODAN) 5-1.5 MG/5ML syrup Take 5 mLs by mouth every 4 (four) hours as needed for cough.  . pantoprazole (PROTONIX) 40 MG tablet Take 1 tablet (40 mg total) by mouth daily. Take 30-60 min before first meal of the day              Objective:   Physical Exam  amb obese wf all smiles     06/18/2017       265   06/04/17 264 lb (119.7 kg)  06/01/17 266 lb 6.4 oz (120.8 kg)  05/21/17 260 lb 12.8 oz (118.3 kg)     Vital signs reviewed - Note on arrival 02 sats  99% on RA       HEENT: nl dentition, turbinates bilaterally, and oropharynx. Nl  external ear canals without cough reflex   NECK :  without JVD/Nodes/TM/ nl carotid upstrokes bilaterally   LUNGS: no acc muscle use,  Nl contour chest which is clear to A and P bilaterally without cough on insp or exp maneuvers   CV:  RRR  no s3 or murmur or increase in P2, and no edema   ABD:  soft and nontender with nl inspiratory excursion in the supine position. No bruits or organomegaly appreciated, bowel sounds nl  MS:  Nl gait/ ext warm without deformities, calf tenderness, cyanosis or clubbing No obvious joint restrictions   SKIN: warm and dry without lesions    NEURO:  alert, approp, nl sensorium with  no motor or cerebellar deficits apparent.   Labs ordered/ reviewed:      Chemistry      Component Value Date/Time   NA 140 06/18/2017 1213   NA 141 06/18/2015 1159   K 3.8 06/18/2017 1213   CL 106 06/18/2017 1213   CO2 27 06/18/2017 1213   BUN 8 06/18/2017 1213   BUN 9 06/18/2015 1159   CREATININE 0.66 06/18/2017 1213      Component Value Date/Time   CALCIUM 8.6 06/18/2017 1213                             Lab Results  Component Value Date   WBC 6.9 06/18/2017   HGB 12.2 06/18/2017   HCT 37.0 06/18/2017   MCV 87.4 06/18/2017   PLT 273.0 06/18/2017       EOS                                                               0.1                                    06/18/2017       Lab Results  Component Value Date   TSH 2.23 06/18/2017     Lab Results  Component Value Date   PROBNP 43.0 06/18/2017                  Assessment:

## 2017-06-18 NOTE — Patient Instructions (Addendum)
Weight control is simply a matter of calorie balance which needs to be tilted in your favor by eating less and exercising more.  To get the most out of exercise, you need to be continuously aware that you are short of breath, but never out of breath, for 30 minutes daily. As you improve, it will actually be easier for you to do the same amount of exercise  in  30 minutes so always push to the level where you are short of breath.  If this does not result in gradual weight reduction then I strongly recommend you see a nutritionist with a food diary x 2 weeks so that we can work out a negative calorie balance which is universally effective in steady weight loss programs.  Think of your calorie balance like you do your bank account where in this case you want the balance to go down so you must take in less calories than you burn up.  It's just that simple:  Hard to do, but easy to understand.  Good luck!     After 2 weeks try off of the protonix and just take pepcid twice daily = after supper and after breakfast then just after breakfast after 2 weeks and then try off    Please remember to go to the lab department downstairs in the basement  for your tests - we will call you with the results when they are available.      Pulmonary follow up is as needed

## 2017-06-19 ENCOUNTER — Institutional Professional Consult (permissible substitution): Payer: BC Managed Care – PPO | Admitting: Internal Medicine

## 2017-06-19 LAB — TSH: TSH: 2.23 u[IU]/mL (ref 0.35–4.50)

## 2017-06-19 NOTE — Progress Notes (Signed)
Spoke with pt and notified of results per Dr. Wert. Pt verbalized understanding and denied any questions. 

## 2017-06-19 NOTE — Progress Notes (Signed)
Left a detailed msg for the pt letting her know that the labs are normal

## 2017-06-20 ENCOUNTER — Encounter: Payer: Self-pay | Admitting: Internal Medicine

## 2017-06-20 NOTE — Assessment & Plan Note (Signed)
Most c/w obesity/ deconditioning   Reviewed reconditioning and need to f/u here if not making progress with regular submax ex

## 2017-06-20 NOTE — Assessment & Plan Note (Signed)
Cyclical cough rx 06/04/2017 and max rx for gerd > resolved, ok to try to wean off rx and gi f/u if flares    Pulmonary f/u prn

## 2017-08-25 ENCOUNTER — Other Ambulatory Visit: Payer: Self-pay | Admitting: Internal Medicine

## 2017-09-07 ENCOUNTER — Telehealth: Payer: Self-pay

## 2017-09-07 DIAGNOSIS — A048 Other specified bacterial intestinal infections: Secondary | ICD-10-CM

## 2017-09-07 NOTE — Telephone Encounter (Signed)
Called and spoke to pt.  She agreed to go to lab for H. Pylori testing. Instructed her to hold her Protonix for 2 weeks prior to submitting her sample.  She also asked to schedule her for a  colonoscopy that was due last year.  Schedule her for 11-12-17 and nurse previsit for 10-26-17. Orders entered for H pylori stool test.

## 2017-09-07 NOTE — Telephone Encounter (Signed)
Thanks Jan 

## 2017-09-07 NOTE — Telephone Encounter (Signed)
-----   Message from Benancio DeedsSteven P Armbruster, MD sent at 09/07/2017  7:39 AM EDT ----- Cassell ClementHi Jan, I got a message that this patient never went back for H pylori stool testing after treatment for H pylori, this was about a year ago. Can you touch base with her to see if she is willing to do this. She would need to hold her protonix for 2 weeks prior to submitting a stool test. Thanks  ----- Message ----- From: SYSTEM Sent: 09/06/2017  12:07 AM To: Benancio DeedsSteven P Armbruster, MD

## 2017-11-12 ENCOUNTER — Encounter: Payer: BC Managed Care – PPO | Admitting: Gastroenterology

## 2019-10-21 IMAGING — DX DG CHEST 2V
2 series · 2 of 2 positions shown · non-contrast
Comparison: Chest x-ray of February 08, 2016

CLINICAL DATA: Recent flu syndrome. Persistent cough and congestion

EXAM:
CHEST  2 VIEW

[chest pa]
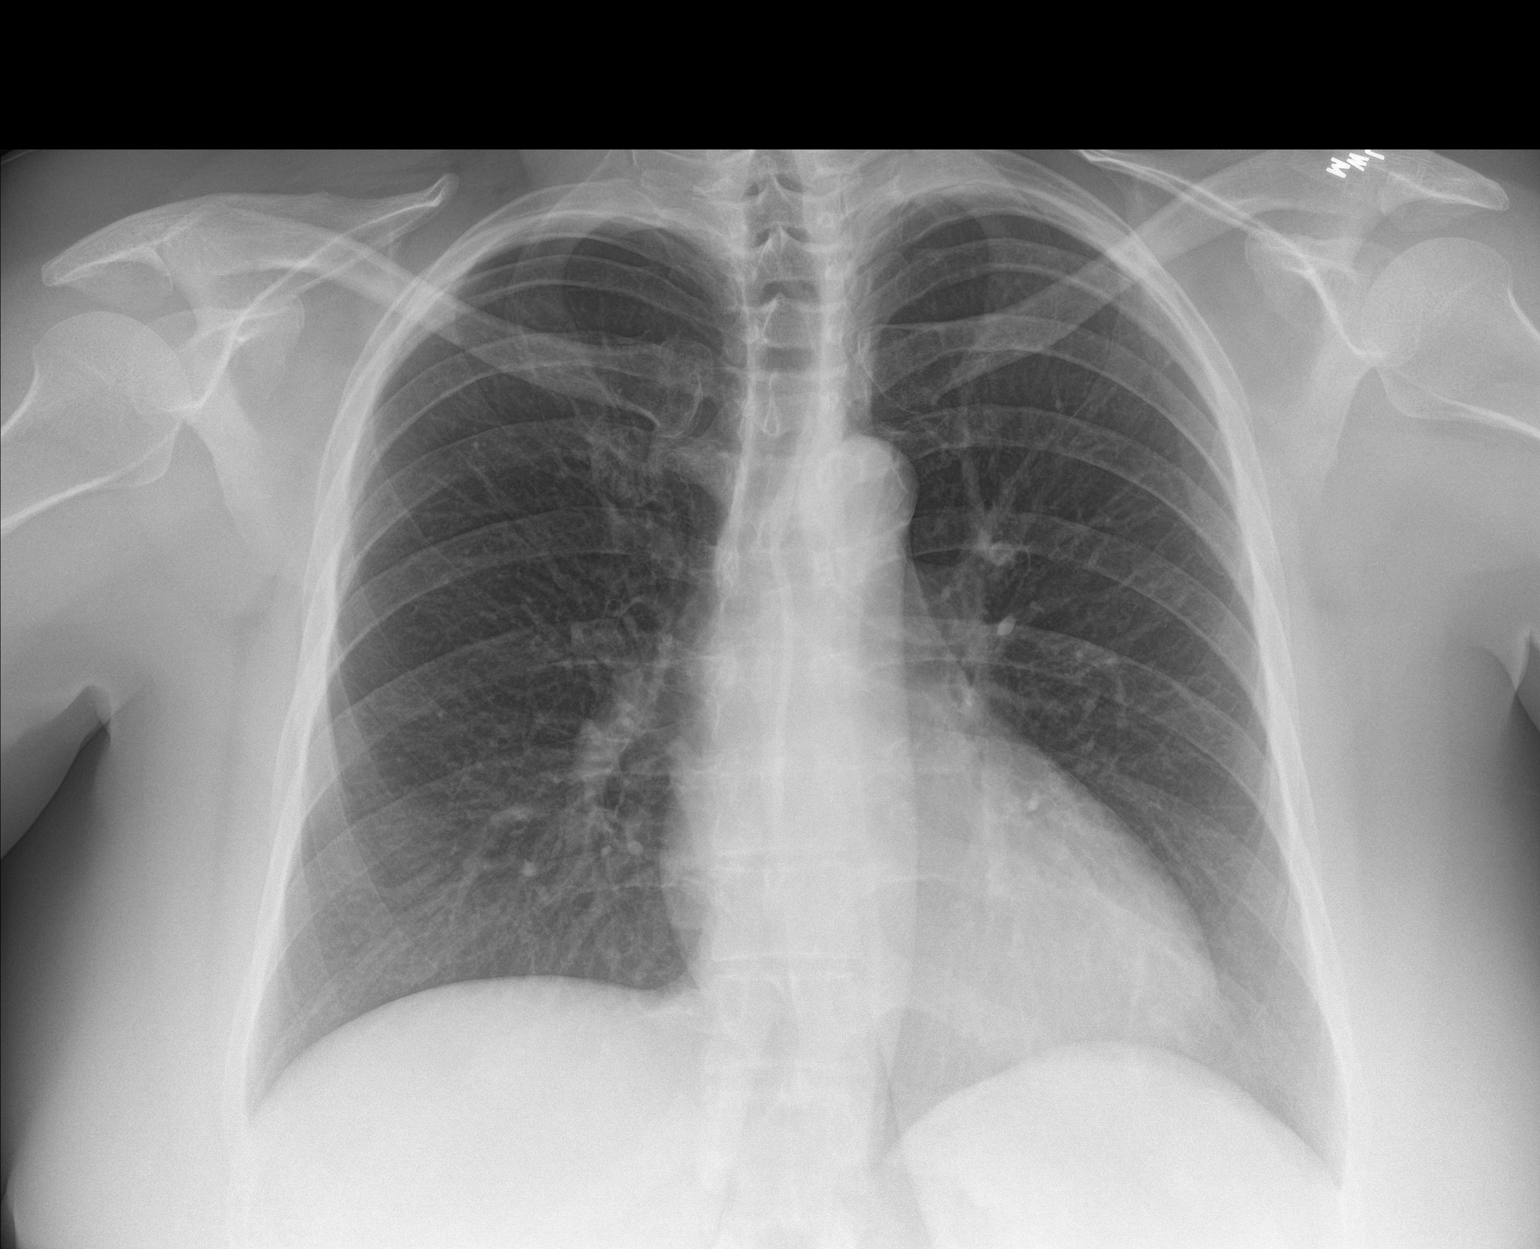

[chest lat]
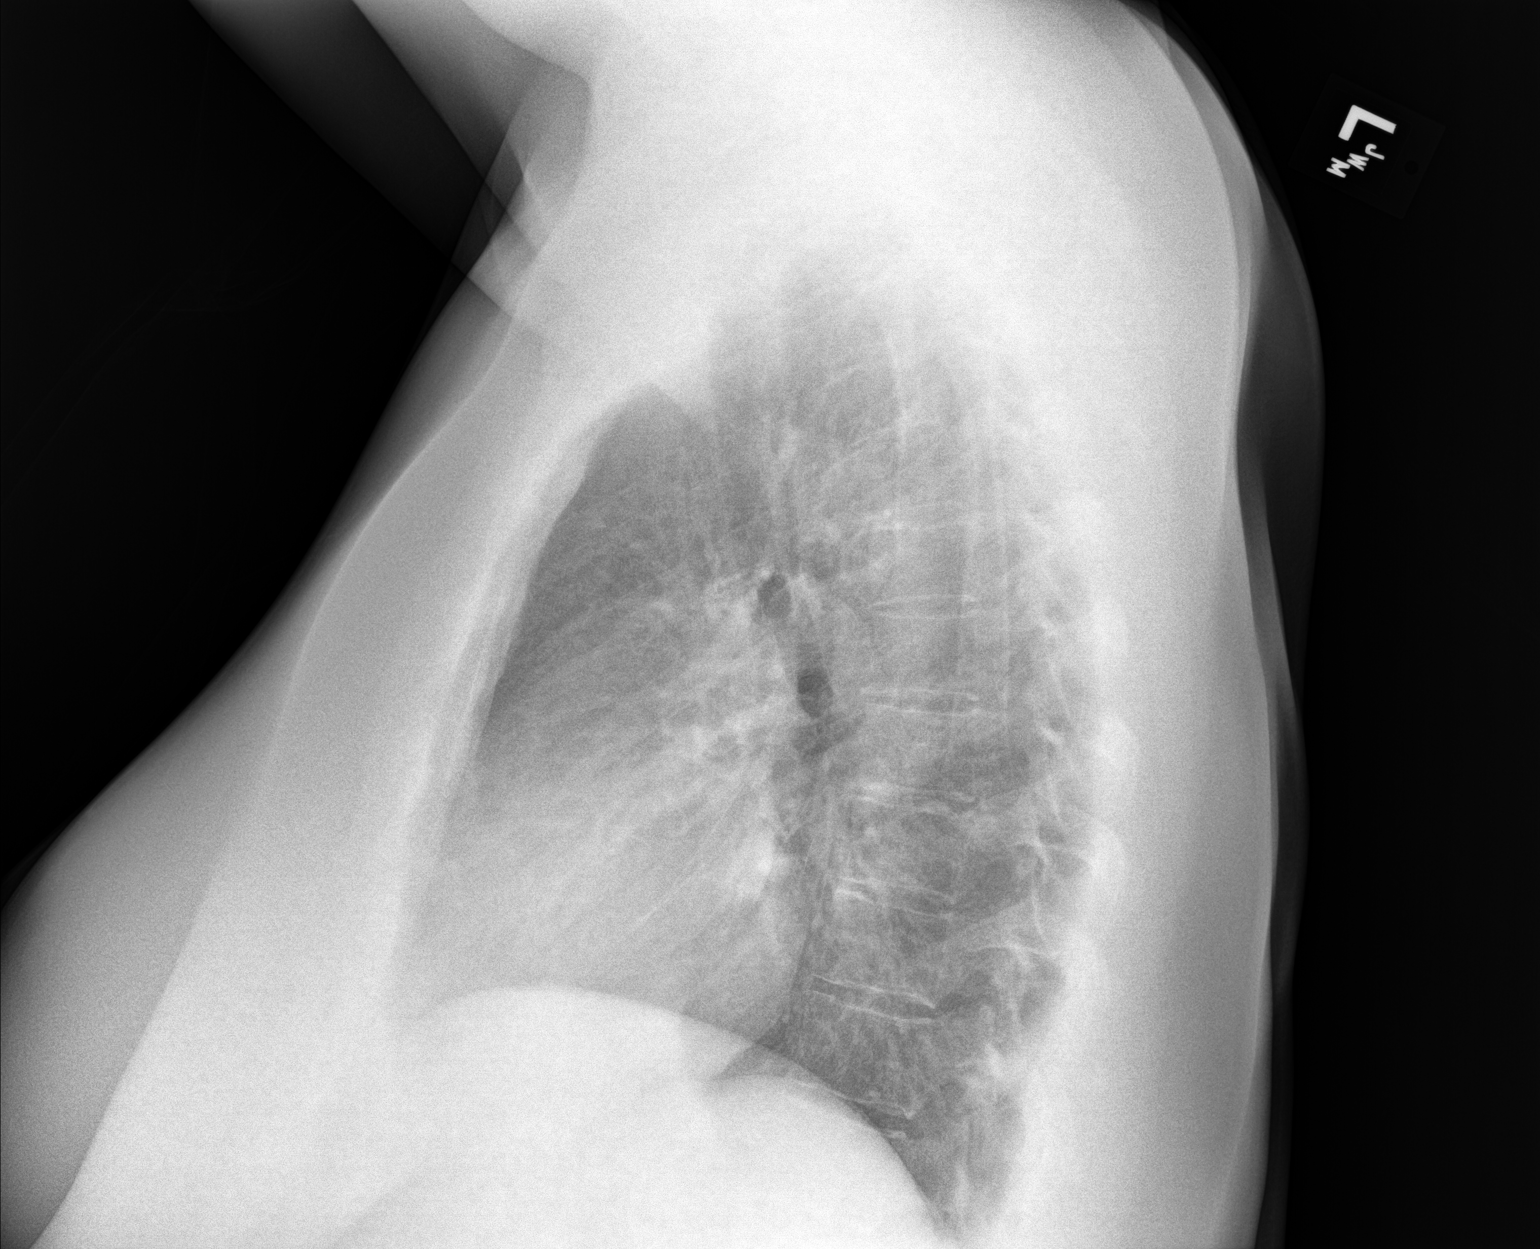

[2 of 2 positions shown; findings below may reference images not displayed]

FINDINGS: The lungs are well-expanded and clear. The heart and pulmonary
vascularity are normal. The mediastinum is normal in width. There is
no pleural effusion. The bony thorax is unremarkable.
IMPRESSION: There is no active cardiopulmonary disease.
